# Patient Record
Sex: Female | Born: 1935 | Race: White | Hispanic: No | Smoking: Never smoker
Health system: Southern US, Community
[De-identification: ages and names within clinical notes are randomized; demographics above are authoritative.]

## PROBLEM LIST (undated history)

## (undated) DIAGNOSIS — K219 Gastro-esophageal reflux disease without esophagitis: Secondary | ICD-10-CM

## (undated) DIAGNOSIS — C801 Malignant (primary) neoplasm, unspecified: Secondary | ICD-10-CM

## (undated) DIAGNOSIS — M1990 Unspecified osteoarthritis, unspecified site: Secondary | ICD-10-CM

## (undated) DIAGNOSIS — I10 Essential (primary) hypertension: Secondary | ICD-10-CM

## (undated) DIAGNOSIS — IMO0001 Reserved for inherently not codable concepts without codable children: Secondary | ICD-10-CM

## (undated) DIAGNOSIS — H3530 Unspecified macular degeneration: Secondary | ICD-10-CM

## (undated) HISTORY — DX: Reserved for inherently not codable concepts without codable children: IMO0001

## (undated) HISTORY — DX: Malignant (primary) neoplasm, unspecified: C80.1

## (undated) HISTORY — DX: Unspecified macular degeneration: H35.30

## (undated) HISTORY — DX: Gastro-esophageal reflux disease without esophagitis: K21.9

## (undated) HISTORY — DX: Essential (primary) hypertension: I10

## (undated) HISTORY — DX: Unspecified osteoarthritis, unspecified site: M19.90

## (undated) HISTORY — PX: APPENDECTOMY: SHX54

## (undated) HISTORY — PX: TONSILLECTOMY: SHX5217

---

## 2000-04-24 ENCOUNTER — Encounter (INDEPENDENT_AMBULATORY_CARE_PROVIDER_SITE_OTHER): Payer: Self-pay

## 2000-04-24 ENCOUNTER — Ambulatory Visit (HOSPITAL_COMMUNITY): Admission: RE | Admit: 2000-04-24 | Discharge: 2000-04-24 | Payer: Self-pay | Admitting: Gastroenterology

## 2005-03-17 ENCOUNTER — Encounter: Admission: RE | Admit: 2005-03-17 | Discharge: 2005-03-17 | Payer: Self-pay | Admitting: Internal Medicine

## 2006-05-20 ENCOUNTER — Emergency Department (HOSPITAL_COMMUNITY): Admission: EM | Admit: 2006-05-20 | Discharge: 2006-05-20 | Payer: Self-pay | Admitting: Emergency Medicine

## 2007-01-28 ENCOUNTER — Emergency Department (HOSPITAL_COMMUNITY): Admission: EM | Admit: 2007-01-28 | Discharge: 2007-01-28 | Payer: Self-pay | Admitting: Emergency Medicine

## 2010-07-23 NOTE — Procedures (Signed)
[facility]  Patient:    Faith Bolton, Faith Bolton                      MRN: [id number] Proc. Date: 04/24/00 Adm. Date:  [id number] Attending:  Orland Mustard CC:         Laban Emperor. Cloward, M.D., [facility] [address]   Procedure Report  PROCEDURES PERFORMED:  Sigmoidoscopy and biopsy.  MEDICATIONS:  None.  SCOPE UTILIZED:  Olympus pediatric video colonoscope.  INDICATIONS:  Nice 77 year old woman who took Cleocin in December for an infected tooth and then developed diarrhea ever since then. She has been treating with Lomotil and lacto bacillus and had an elective appointment to see one of the physicians in our office at the end of March. Dr. Andi Devon called, she was still having diarrhea and felt that really should not wait that long. Given her clinical history of the use of Cleocin with the diarrhea starting soon after that, it was felt that we needed to go ahead with an unprepped sigmoid to rule out pseudomembranous colitis.  DESCRIPTION OF PROCEDURE:  The procedure had been explained to the patient and consent obtained. With the patient in the left lateral decubitus position, the Olympus pediatric video colonoscope was inserted. This was done in the unprepped state. The patient had some areas of solid stool; we passed this. There was moderate diverticulosis. We wiggled through all this around the stool. There were areas of inflammation and redness that were scattered with some mucous-type discharge, but no clear pseudomembranes. We went up to 50 cm and at this point it became uncomfortable to the patient and I elected to terminate the procedure. The scope was withdrawn. Several random biopsies were taken of the areas of apparent inflammation. The patient tolerated the procedure well and was maintained on low-flow oxygen and pulse oximetry throughout the procedure.  ASSESSMENT:  Resolving mild colitis, possibly pseudomembranous, but it  clearly seems to be improving.  PLAN:  We will go ahead and check the pathology report and see her back in the office on April 28, 2000 at 11:00.  We will keep her on the same medicines for now. DD:  04/24/00 TD:  04/24/00 Job: [id number]

## 2010-07-23 NOTE — Procedures (Signed)
[facility]  Patient:    Faith Bolton, Faith Bolton                      MRN: [id number] Proc. Date: 04/24/00 Adm. Date:  [id number] Attending:  Orland Mustard CC:         Gabriel Earing, M.D., [facility],  [address] [place], [place]   Procedure Report  PROCEDURE:  Sigmoidoscopy and biopsy.  MEDICATIONS:  None.  SCOPE:  Olympus pediatric colonoscope.  INDICATIONS FOR PROCEDURE:  A nice 77 year old woman who took Cleocin in December for an infection tooth and developed diarrhea every since then. She has been treated with Lomotil and Lactobacillus. She had an elective appointment to see one of the physicians in our office at the end of March. Dr. Andi Devon called, she was still having diarrhea and felt we really shouldnt wait that long. Given her clinical history, the use of Cleocin, diarrhea starting soon after that it was felt that we needed to go ahead with an unprepped sigmoid to rule out Pseudomonas colitis.  DESCRIPTION OF PROCEDURE:  The procedure had been explained to the patient and consent obtained. With the patient in the left lateral decubitus position, the Olympus pediatric video colonoscope was inserted. This was done in the unprepped state. The patient had some area of silent stool. We passed this. There was moderate diverticulosis. We wiggled through all the surrounding stool. There were areas of inflammation and redness that was scattered and some mucusy discharge but no clear pseudomembranes. We went up to 50 cm. At this point, it became uncomfortable to the patient and I elected to terminate the procedure. The scope was withdrawn. Several random biopsies were taken in the areas of apparent inflammation. The patient tolerated the procedure well and was maintained on low flow oxygen and pulse oximeter throughout the procedure.  ASSESSMENT:  Resolving mild colitis possibly pseudomembranous but it clearly seems to be improving.  PLAN:   Will go ahead and check report and see back in the office on February 22 at 11:00. Keep her on the same medicines for now. DD:  04/24/00 TD:  04/25/00 Job: [id number]

## 2010-12-14 LAB — POCT CARDIAC MARKERS
Myoglobin, poc: 137
Operator id: 272551
Operator id: 272551
Operator id: 272551
Troponin i, poc: <0.05
Troponin i, poc: <0.05

## 2010-12-14 LAB — URINE MICROSCOPIC-ADD ON

## 2010-12-14 LAB — BASIC METABOLIC PANEL
BUN: 9
CO2: 27
Calcium: 9.3
Glucose, Bld: 135 — ABNORMAL HIGH
Potassium: 4.4

## 2010-12-14 LAB — URINE CULTURE: Colony Count: 85000

## 2010-12-14 LAB — DIFFERENTIAL
Basophils Absolute: 0
Basophils Relative: 0
Eosinophils Absolute: 0.1 — ABNORMAL LOW
Eosinophils Relative: 1
Lymphs Abs: 1.7
Neutrophils Relative %: 65

## 2010-12-14 LAB — APTT: aPTT: 27

## 2010-12-14 LAB — URINALYSIS, ROUTINE W REFLEX MICROSCOPIC
Bilirubin Urine: NEGATIVE
Glucose, UA: NEGATIVE
Nitrite: NEGATIVE
Protein, ur: NEGATIVE
Urobilinogen, UA: 0.2

## 2010-12-14 LAB — CBC
Hemoglobin: 13.1
MCHC: 33.7
RDW: 13.3
WBC: 6.8

## 2010-12-14 LAB — D-DIMER, QUANTITATIVE: D-Dimer, Quant: 0.74 — ABNORMAL HIGH

## 2010-12-14 LAB — PROTIME-INR
INR: 0.9
Prothrombin Time: 12.4

## 2011-03-25 DIAGNOSIS — H353290 Exudative age-related macular degeneration, unspecified eye, stage unspecified: Secondary | ICD-10-CM | POA: Diagnosis not present

## 2011-03-25 DIAGNOSIS — H35059 Retinal neovascularization, unspecified, unspecified eye: Secondary | ICD-10-CM | POA: Diagnosis not present

## 2011-03-25 DIAGNOSIS — H43819 Vitreous degeneration, unspecified eye: Secondary | ICD-10-CM | POA: Diagnosis not present

## 2011-04-07 DIAGNOSIS — H3530 Unspecified macular degeneration: Secondary | ICD-10-CM | POA: Diagnosis not present

## 2011-04-07 DIAGNOSIS — H04129 Dry eye syndrome of unspecified lacrimal gland: Secondary | ICD-10-CM | POA: Diagnosis not present

## 2011-04-07 DIAGNOSIS — Z961 Presence of intraocular lens: Secondary | ICD-10-CM | POA: Diagnosis not present

## 2011-06-24 DIAGNOSIS — H43819 Vitreous degeneration, unspecified eye: Secondary | ICD-10-CM | POA: Diagnosis not present

## 2011-06-24 DIAGNOSIS — H34239 Retinal artery branch occlusion, unspecified eye: Secondary | ICD-10-CM | POA: Diagnosis not present

## 2011-06-24 DIAGNOSIS — H353290 Exudative age-related macular degeneration, unspecified eye, stage unspecified: Secondary | ICD-10-CM | POA: Diagnosis not present

## 2011-06-24 DIAGNOSIS — H35059 Retinal neovascularization, unspecified, unspecified eye: Secondary | ICD-10-CM | POA: Diagnosis not present

## 2011-07-07 DIAGNOSIS — H34239 Retinal artery branch occlusion, unspecified eye: Secondary | ICD-10-CM | POA: Diagnosis not present

## 2011-07-07 DIAGNOSIS — M1990 Unspecified osteoarthritis, unspecified site: Secondary | ICD-10-CM | POA: Diagnosis not present

## 2011-07-07 DIAGNOSIS — I10 Essential (primary) hypertension: Secondary | ICD-10-CM | POA: Diagnosis not present

## 2011-07-07 DIAGNOSIS — J301 Allergic rhinitis due to pollen: Secondary | ICD-10-CM | POA: Diagnosis not present

## 2011-07-08 DIAGNOSIS — H349 Unspecified retinal vascular occlusion: Secondary | ICD-10-CM | POA: Diagnosis not present

## 2011-10-28 DIAGNOSIS — H43819 Vitreous degeneration, unspecified eye: Secondary | ICD-10-CM | POA: Diagnosis not present

## 2011-10-28 DIAGNOSIS — H353290 Exudative age-related macular degeneration, unspecified eye, stage unspecified: Secondary | ICD-10-CM | POA: Diagnosis not present

## 2011-10-28 DIAGNOSIS — H34239 Retinal artery branch occlusion, unspecified eye: Secondary | ICD-10-CM | POA: Diagnosis not present

## 2011-10-28 DIAGNOSIS — H35059 Retinal neovascularization, unspecified, unspecified eye: Secondary | ICD-10-CM | POA: Diagnosis not present

## 2011-12-30 DIAGNOSIS — H353290 Exudative age-related macular degeneration, unspecified eye, stage unspecified: Secondary | ICD-10-CM | POA: Diagnosis not present

## 2011-12-30 DIAGNOSIS — H43819 Vitreous degeneration, unspecified eye: Secondary | ICD-10-CM | POA: Diagnosis not present

## 2011-12-30 DIAGNOSIS — H35059 Retinal neovascularization, unspecified, unspecified eye: Secondary | ICD-10-CM | POA: Diagnosis not present

## 2011-12-30 DIAGNOSIS — H34239 Retinal artery branch occlusion, unspecified eye: Secondary | ICD-10-CM | POA: Diagnosis not present

## 2012-01-19 DIAGNOSIS — Z23 Encounter for immunization: Secondary | ICD-10-CM | POA: Diagnosis not present

## 2012-01-19 DIAGNOSIS — R82998 Other abnormal findings in urine: Secondary | ICD-10-CM | POA: Diagnosis not present

## 2012-01-19 DIAGNOSIS — I10 Essential (primary) hypertension: Secondary | ICD-10-CM | POA: Diagnosis not present

## 2012-01-19 DIAGNOSIS — M1990 Unspecified osteoarthritis, unspecified site: Secondary | ICD-10-CM | POA: Diagnosis not present

## 2012-01-19 DIAGNOSIS — J301 Allergic rhinitis due to pollen: Secondary | ICD-10-CM | POA: Diagnosis not present

## 2012-01-19 DIAGNOSIS — H3530 Unspecified macular degeneration: Secondary | ICD-10-CM | POA: Diagnosis not present

## 2012-04-12 DIAGNOSIS — Z961 Presence of intraocular lens: Secondary | ICD-10-CM | POA: Diagnosis not present

## 2012-04-12 DIAGNOSIS — H04129 Dry eye syndrome of unspecified lacrimal gland: Secondary | ICD-10-CM | POA: Diagnosis not present

## 2012-04-12 DIAGNOSIS — H3530 Unspecified macular degeneration: Secondary | ICD-10-CM | POA: Diagnosis not present

## 2012-04-13 DIAGNOSIS — M2010 Hallux valgus (acquired), unspecified foot: Secondary | ICD-10-CM | POA: Diagnosis not present

## 2012-04-13 DIAGNOSIS — L608 Other nail disorders: Secondary | ICD-10-CM | POA: Diagnosis not present

## 2012-05-04 DIAGNOSIS — H35379 Puckering of macula, unspecified eye: Secondary | ICD-10-CM | POA: Diagnosis not present

## 2012-05-04 DIAGNOSIS — H353290 Exudative age-related macular degeneration, unspecified eye, stage unspecified: Secondary | ICD-10-CM | POA: Diagnosis not present

## 2012-05-04 DIAGNOSIS — H34239 Retinal artery branch occlusion, unspecified eye: Secondary | ICD-10-CM | POA: Diagnosis not present

## 2012-05-04 DIAGNOSIS — H43819 Vitreous degeneration, unspecified eye: Secondary | ICD-10-CM | POA: Diagnosis not present

## 2012-07-19 DIAGNOSIS — Z6828 Body mass index (BMI) 28.0-28.9, adult: Secondary | ICD-10-CM | POA: Diagnosis not present

## 2012-07-19 DIAGNOSIS — J301 Allergic rhinitis due to pollen: Secondary | ICD-10-CM | POA: Diagnosis not present

## 2012-07-19 DIAGNOSIS — H3530 Unspecified macular degeneration: Secondary | ICD-10-CM | POA: Diagnosis not present

## 2012-07-19 DIAGNOSIS — R269 Unspecified abnormalities of gait and mobility: Secondary | ICD-10-CM | POA: Diagnosis not present

## 2012-07-19 DIAGNOSIS — M1990 Unspecified osteoarthritis, unspecified site: Secondary | ICD-10-CM | POA: Diagnosis not present

## 2012-07-19 DIAGNOSIS — I10 Essential (primary) hypertension: Secondary | ICD-10-CM | POA: Diagnosis not present

## 2012-07-19 DIAGNOSIS — Z1331 Encounter for screening for depression: Secondary | ICD-10-CM | POA: Diagnosis not present

## 2012-11-08 DIAGNOSIS — R05 Cough: Secondary | ICD-10-CM | POA: Diagnosis not present

## 2012-11-08 DIAGNOSIS — R509 Fever, unspecified: Secondary | ICD-10-CM | POA: Diagnosis not present

## 2012-11-08 DIAGNOSIS — R82998 Other abnormal findings in urine: Secondary | ICD-10-CM | POA: Diagnosis not present

## 2012-11-08 DIAGNOSIS — N390 Urinary tract infection, site not specified: Secondary | ICD-10-CM | POA: Diagnosis not present

## 2012-11-08 DIAGNOSIS — Z6828 Body mass index (BMI) 28.0-28.9, adult: Secondary | ICD-10-CM | POA: Diagnosis not present

## 2012-11-08 DIAGNOSIS — M545 Low back pain: Secondary | ICD-10-CM | POA: Diagnosis not present

## 2012-11-08 DIAGNOSIS — R944 Abnormal results of kidney function studies: Secondary | ICD-10-CM | POA: Diagnosis not present

## 2012-11-16 DIAGNOSIS — R509 Fever, unspecified: Secondary | ICD-10-CM | POA: Diagnosis not present

## 2012-11-16 DIAGNOSIS — R05 Cough: Secondary | ICD-10-CM | POA: Diagnosis not present

## 2012-11-16 DIAGNOSIS — N390 Urinary tract infection, site not specified: Secondary | ICD-10-CM | POA: Diagnosis not present

## 2012-11-16 DIAGNOSIS — Z6828 Body mass index (BMI) 28.0-28.9, adult: Secondary | ICD-10-CM | POA: Diagnosis not present

## 2012-11-20 DIAGNOSIS — H35059 Retinal neovascularization, unspecified, unspecified eye: Secondary | ICD-10-CM | POA: Diagnosis not present

## 2012-11-20 DIAGNOSIS — H353290 Exudative age-related macular degeneration, unspecified eye, stage unspecified: Secondary | ICD-10-CM | POA: Diagnosis not present

## 2012-12-26 DIAGNOSIS — R05 Cough: Secondary | ICD-10-CM | POA: Diagnosis not present

## 2012-12-26 DIAGNOSIS — R110 Nausea: Secondary | ICD-10-CM | POA: Diagnosis not present

## 2012-12-26 DIAGNOSIS — J301 Allergic rhinitis due to pollen: Secondary | ICD-10-CM | POA: Diagnosis not present

## 2012-12-26 DIAGNOSIS — Z6828 Body mass index (BMI) 28.0-28.9, adult: Secondary | ICD-10-CM | POA: Diagnosis not present

## 2012-12-26 DIAGNOSIS — Z23 Encounter for immunization: Secondary | ICD-10-CM | POA: Diagnosis not present

## 2013-01-25 ENCOUNTER — Other Ambulatory Visit: Payer: Self-pay | Admitting: Internal Medicine

## 2013-01-25 DIAGNOSIS — R110 Nausea: Secondary | ICD-10-CM

## 2013-01-25 DIAGNOSIS — I10 Essential (primary) hypertension: Secondary | ICD-10-CM | POA: Diagnosis not present

## 2013-01-25 DIAGNOSIS — R002 Palpitations: Secondary | ICD-10-CM | POA: Diagnosis not present

## 2013-01-25 DIAGNOSIS — Z6827 Body mass index (BMI) 27.0-27.9, adult: Secondary | ICD-10-CM | POA: Diagnosis not present

## 2013-01-25 DIAGNOSIS — R05 Cough: Secondary | ICD-10-CM | POA: Diagnosis not present

## 2013-02-04 ENCOUNTER — Ambulatory Visit
Admission: RE | Admit: 2013-02-04 | Discharge: 2013-02-04 | Disposition: A | Discharge status: Home / Self Care | Payer: Medicare Other | Source: Ambulatory Visit | Attending: Internal Medicine | Admitting: Internal Medicine

## 2013-02-04 ENCOUNTER — Other Ambulatory Visit: Payer: Self-pay | Admitting: Internal Medicine

## 2013-02-04 DIAGNOSIS — K228 Other specified diseases of esophagus: Secondary | ICD-10-CM | POA: Diagnosis not present

## 2013-02-04 DIAGNOSIS — R110 Nausea: Secondary | ICD-10-CM

## 2013-05-08 DIAGNOSIS — M79609 Pain in unspecified limb: Secondary | ICD-10-CM | POA: Diagnosis not present

## 2013-05-08 DIAGNOSIS — N390 Urinary tract infection, site not specified: Secondary | ICD-10-CM | POA: Diagnosis not present

## 2013-05-08 DIAGNOSIS — H3530 Unspecified macular degeneration: Secondary | ICD-10-CM | POA: Diagnosis not present

## 2013-05-08 DIAGNOSIS — Z6828 Body mass index (BMI) 28.0-28.9, adult: Secondary | ICD-10-CM | POA: Diagnosis not present

## 2013-05-08 DIAGNOSIS — I10 Essential (primary) hypertension: Secondary | ICD-10-CM | POA: Diagnosis not present

## 2013-05-08 DIAGNOSIS — R110 Nausea: Secondary | ICD-10-CM | POA: Diagnosis not present

## 2013-05-08 DIAGNOSIS — M1990 Unspecified osteoarthritis, unspecified site: Secondary | ICD-10-CM | POA: Diagnosis not present

## 2013-05-08 DIAGNOSIS — R809 Proteinuria, unspecified: Secondary | ICD-10-CM | POA: Diagnosis not present

## 2013-05-24 ENCOUNTER — Ambulatory Visit: Payer: Medicare Other | Admitting: Podiatrist

## 2013-05-29 DIAGNOSIS — H34239 Retinal artery branch occlusion, unspecified eye: Secondary | ICD-10-CM | POA: Diagnosis not present

## 2013-05-29 DIAGNOSIS — H35059 Retinal neovascularization, unspecified, unspecified eye: Secondary | ICD-10-CM | POA: Diagnosis not present

## 2013-05-29 DIAGNOSIS — H35379 Puckering of macula, unspecified eye: Secondary | ICD-10-CM | POA: Diagnosis not present

## 2013-05-29 DIAGNOSIS — H353290 Exudative age-related macular degeneration, unspecified eye, stage unspecified: Secondary | ICD-10-CM | POA: Diagnosis not present

## 2013-06-18 ENCOUNTER — Encounter: Payer: Self-pay | Admitting: Podiatry

## 2013-06-18 ENCOUNTER — Ambulatory Visit (INDEPENDENT_AMBULATORY_CARE_PROVIDER_SITE_OTHER): Payer: Medicare Other | Admitting: Podiatry

## 2013-06-18 VITALS — BP 124/76 | HR 73 | Resp 16 | Ht 64.0 in | Wt 167.0 lb

## 2013-06-18 DIAGNOSIS — B351 Tinea unguium: Secondary | ICD-10-CM | POA: Diagnosis not present

## 2013-06-18 DIAGNOSIS — M79609 Pain in unspecified limb: Secondary | ICD-10-CM | POA: Diagnosis not present

## 2013-06-18 DIAGNOSIS — L600 Ingrowing nail: Secondary | ICD-10-CM | POA: Diagnosis not present

## 2013-06-18 MED ORDER — NEOMYCIN-POLYMYXIN-HC 3.5-10000-1 OT SOLN: OTIC | Status: DC

## 2013-06-18 NOTE — Progress Notes (Signed)
   Subjective:    Patient ID: Faith Bolton, female    DOB: [birth year], 80 y.o.   MRN: [id number]  HPI Comments: Left great toe is red and swollen and painful. Thick toenail, it was cut on before, its been about 1 year with it   Toe Pain       Review of Systems  HENT:       Sinus problems   Eyes: Positive for visual disturbance.  Cardiovascular: Positive for leg swelling.  Genitourinary: Positive for frequency.  Musculoskeletal:       Back pain  Difficulty walking   All other systems reviewed and are negative.      Objective:   Physical Exam: I have reviewed her past medical history medications allergies surgeries social history. Pulses are palpable bilateral. Capillary fill time to digits one through 5 is immediate in the feet are warm to the touch. Neurologic sensorium is intact. Deep tendon reflexes are not elicitable today muscle strength is normal bilateral. Orthopedic evaluation demonstrates hammertoe deformities and hallux abductovalgus deformities bilateral. Left greater than right. Cutaneous evaluation demonstrates thick yellow dystrophic onychomycotic nails bilateral. Painful ingrown toenail with nail dystrophy of the hallux left mild erythema mild paronychia also noted hallux left.        Assessment & Plan:  Assessment: Ingrown nail paronychia abscess hallux left. Pain in limb secondary to onychomycosis 1 through 5 bilateral.  Plan: At this point a chemical matrixectomy was performed to the toenail plate of the hallux left. She tolerated procedure well with local anesthetic. I also debrided her nails 1 through 5 bilateral. I will followup with her in one week.

## 2013-06-18 NOTE — Patient Instructions (Signed)

## 2013-06-25 ENCOUNTER — Ambulatory Visit (INDEPENDENT_AMBULATORY_CARE_PROVIDER_SITE_OTHER): Payer: Medicare Other | Admitting: Podiatry

## 2013-06-25 ENCOUNTER — Encounter: Payer: Self-pay | Admitting: Podiatry

## 2013-06-25 VITALS — BP 122/76 | HR 72 | Resp 18

## 2013-06-25 DIAGNOSIS — L600 Ingrowing nail: Secondary | ICD-10-CM

## 2013-06-25 NOTE — Progress Notes (Signed)
She presents today for followup of total nail avulsion hallux left. She continues to soak in Betadine and water has been doing well with this. She is very little symptoms and continues to dress the toe daily.  Objective: Vital signs are stable she is alert and oriented x3 she is nice granulation tissue with some islands of epithelialization occurring. Hallux left appears to be healing quite nicely.  Assessment: Well-healing matrixectomy nail avulsion hallux left.  Plan: Discontinue Betadine start with Epsom salts warm water soaks covered in the day and leave open at night. Continue to soak for the next couple of weeks and then followup with me to assure that we had no infection. She will slight for signs and symptoms of infection to notify me if there are any.

## 2013-07-09 ENCOUNTER — Ambulatory Visit (INDEPENDENT_AMBULATORY_CARE_PROVIDER_SITE_OTHER): Payer: Medicare Other | Admitting: Podiatry

## 2013-07-09 ENCOUNTER — Encounter: Payer: Self-pay | Admitting: Podiatry

## 2013-07-09 VITALS — BP 122/76 | HR 72 | Resp 16

## 2013-07-09 DIAGNOSIS — L600 Ingrowing nail: Secondary | ICD-10-CM

## 2013-07-09 NOTE — Progress Notes (Signed)
She presents today 3 weeks status post total nail avulsion hallux left. She denies fever chills nausea vomiting muscle aches and pains. Continues to soak daily in Epsom salts and water. She continues to use of the Cortisporin Otic.  Objective: Vital signs are stable she is alert and oriented x3 dry sterile dressing on toe hallux left was removed demonstrates mild granulation tissue with epithelialization to the hallux nail bed left. I see no signs of infection.  Assessment: Nail avulsion healing well left.  Plan: Continue to soak in Epsom salts and water apply Cortisporin otic daily cover and I will followup with her in 2 weeks.

## 2013-07-23 ENCOUNTER — Ambulatory Visit (INDEPENDENT_AMBULATORY_CARE_PROVIDER_SITE_OTHER): Payer: Medicare Other | Admitting: Podiatry

## 2013-07-23 ENCOUNTER — Encounter: Payer: Self-pay | Admitting: Podiatry

## 2013-07-23 VITALS — BP 114/76 | HR 68 | Resp 16

## 2013-07-23 DIAGNOSIS — L600 Ingrowing nail: Secondary | ICD-10-CM | POA: Diagnosis not present

## 2013-07-23 NOTE — Progress Notes (Signed)
She presents today for followup nail avulsion hallux left. She states she start soaking it twice daily. It feels like is doing a lot better.  Objective: Vital signs are stable she is alert and oriented x3. Pulses are palpable left. Hallux nail bed appears to be granulating in very nicely epithelialization is appear to be developing and the skin I see no signs of infection.  Assessment: Well-healing surgical foot hallux left.  Plan: Start soaking once daily. Followup with me on an as needed basis. Continue to soak and to completely well.

## 2013-09-27 DIAGNOSIS — H353290 Exudative age-related macular degeneration, unspecified eye, stage unspecified: Secondary | ICD-10-CM | POA: Diagnosis not present

## 2013-09-27 DIAGNOSIS — H43819 Vitreous degeneration, unspecified eye: Secondary | ICD-10-CM | POA: Diagnosis not present

## 2013-09-27 DIAGNOSIS — H34239 Retinal artery branch occlusion, unspecified eye: Secondary | ICD-10-CM | POA: Diagnosis not present

## 2013-09-27 DIAGNOSIS — H35379 Puckering of macula, unspecified eye: Secondary | ICD-10-CM | POA: Diagnosis not present

## 2013-11-12 DIAGNOSIS — H3530 Unspecified macular degeneration: Secondary | ICD-10-CM | POA: Diagnosis not present

## 2013-11-12 DIAGNOSIS — Z1331 Encounter for screening for depression: Secondary | ICD-10-CM | POA: Diagnosis not present

## 2013-11-12 DIAGNOSIS — J301 Allergic rhinitis due to pollen: Secondary | ICD-10-CM | POA: Diagnosis not present

## 2013-11-12 DIAGNOSIS — R110 Nausea: Secondary | ICD-10-CM | POA: Diagnosis not present

## 2013-11-12 DIAGNOSIS — R269 Unspecified abnormalities of gait and mobility: Secondary | ICD-10-CM | POA: Diagnosis not present

## 2013-11-12 DIAGNOSIS — I10 Essential (primary) hypertension: Secondary | ICD-10-CM | POA: Diagnosis not present

## 2014-01-08 DIAGNOSIS — Z6828 Body mass index (BMI) 28.0-28.9, adult: Secondary | ICD-10-CM | POA: Diagnosis not present

## 2014-01-08 DIAGNOSIS — H04329 Acute dacryocystitis of unspecified lacrimal passage: Secondary | ICD-10-CM | POA: Diagnosis not present

## 2014-01-08 DIAGNOSIS — Z23 Encounter for immunization: Secondary | ICD-10-CM | POA: Diagnosis not present

## 2014-04-15 DIAGNOSIS — Z961 Presence of intraocular lens: Secondary | ICD-10-CM | POA: Diagnosis not present

## 2014-04-15 DIAGNOSIS — H02831 Dermatochalasis of right upper eyelid: Secondary | ICD-10-CM | POA: Diagnosis not present

## 2014-04-15 DIAGNOSIS — H02834 Dermatochalasis of left upper eyelid: Secondary | ICD-10-CM | POA: Diagnosis not present

## 2014-04-15 DIAGNOSIS — H1851 Endothelial corneal dystrophy: Secondary | ICD-10-CM | POA: Diagnosis not present

## 2014-04-15 DIAGNOSIS — H3531 Nonexudative age-related macular degeneration: Secondary | ICD-10-CM | POA: Diagnosis not present

## 2014-04-25 DIAGNOSIS — H3532 Exudative age-related macular degeneration: Secondary | ICD-10-CM | POA: Diagnosis not present

## 2014-05-13 DIAGNOSIS — M1990 Unspecified osteoarthritis, unspecified site: Secondary | ICD-10-CM | POA: Diagnosis not present

## 2014-05-13 DIAGNOSIS — N390 Urinary tract infection, site not specified: Secondary | ICD-10-CM | POA: Diagnosis not present

## 2014-05-13 DIAGNOSIS — J302 Other seasonal allergic rhinitis: Secondary | ICD-10-CM | POA: Diagnosis not present

## 2014-05-13 DIAGNOSIS — H3530 Unspecified macular degeneration: Secondary | ICD-10-CM | POA: Diagnosis not present

## 2014-05-13 DIAGNOSIS — Z6828 Body mass index (BMI) 28.0-28.9, adult: Secondary | ICD-10-CM | POA: Diagnosis not present

## 2014-05-13 DIAGNOSIS — Z1389 Encounter for screening for other disorder: Secondary | ICD-10-CM | POA: Diagnosis not present

## 2014-05-13 DIAGNOSIS — R002 Palpitations: Secondary | ICD-10-CM | POA: Diagnosis not present

## 2014-05-13 DIAGNOSIS — R2689 Other abnormalities of gait and mobility: Secondary | ICD-10-CM | POA: Diagnosis not present

## 2014-05-13 DIAGNOSIS — I10 Essential (primary) hypertension: Secondary | ICD-10-CM | POA: Diagnosis not present

## 2014-05-13 DIAGNOSIS — R8299 Other abnormal findings in urine: Secondary | ICD-10-CM | POA: Diagnosis not present

## 2014-08-14 DIAGNOSIS — M5431 Sciatica, right side: Secondary | ICD-10-CM | POA: Diagnosis not present

## 2014-11-11 DIAGNOSIS — I10 Essential (primary) hypertension: Secondary | ICD-10-CM | POA: Diagnosis not present

## 2014-11-11 DIAGNOSIS — M419 Scoliosis, unspecified: Secondary | ICD-10-CM | POA: Diagnosis not present

## 2014-11-11 DIAGNOSIS — H3530 Unspecified macular degeneration: Secondary | ICD-10-CM | POA: Diagnosis not present

## 2014-11-11 DIAGNOSIS — M519 Unspecified thoracic, thoracolumbar and lumbosacral intervertebral disc disorder: Secondary | ICD-10-CM | POA: Diagnosis not present

## 2014-11-11 DIAGNOSIS — Z6827 Body mass index (BMI) 27.0-27.9, adult: Secondary | ICD-10-CM | POA: Diagnosis not present

## 2014-11-11 DIAGNOSIS — M1990 Unspecified osteoarthritis, unspecified site: Secondary | ICD-10-CM | POA: Diagnosis not present

## 2014-11-11 DIAGNOSIS — M47819 Spondylosis without myelopathy or radiculopathy, site unspecified: Secondary | ICD-10-CM | POA: Diagnosis not present

## 2014-12-05 DIAGNOSIS — H3532 Exudative age-related macular degeneration: Secondary | ICD-10-CM | POA: Diagnosis not present

## 2014-12-05 DIAGNOSIS — H43813 Vitreous degeneration, bilateral: Secondary | ICD-10-CM | POA: Diagnosis not present

## 2014-12-05 DIAGNOSIS — H34231 Retinal artery branch occlusion, right eye: Secondary | ICD-10-CM | POA: Diagnosis not present

## 2014-12-16 DIAGNOSIS — M4806 Spinal stenosis, lumbar region: Secondary | ICD-10-CM | POA: Diagnosis not present

## 2015-01-12 DIAGNOSIS — Z23 Encounter for immunization: Secondary | ICD-10-CM | POA: Diagnosis not present

## 2015-01-27 DIAGNOSIS — M4806 Spinal stenosis, lumbar region: Secondary | ICD-10-CM | POA: Diagnosis not present

## 2015-02-13 DIAGNOSIS — M4806 Spinal stenosis, lumbar region: Secondary | ICD-10-CM | POA: Diagnosis not present

## 2015-04-10 DIAGNOSIS — Z1389 Encounter for screening for other disorder: Secondary | ICD-10-CM | POA: Diagnosis not present

## 2015-04-10 DIAGNOSIS — I10 Essential (primary) hypertension: Secondary | ICD-10-CM | POA: Diagnosis not present

## 2015-04-10 DIAGNOSIS — Z6827 Body mass index (BMI) 27.0-27.9, adult: Secondary | ICD-10-CM | POA: Diagnosis not present

## 2015-04-10 DIAGNOSIS — R040 Epistaxis: Secondary | ICD-10-CM | POA: Diagnosis not present

## 2015-04-10 DIAGNOSIS — M4806 Spinal stenosis, lumbar region: Secondary | ICD-10-CM | POA: Diagnosis not present

## 2015-04-10 DIAGNOSIS — H3530 Unspecified macular degeneration: Secondary | ICD-10-CM | POA: Diagnosis not present

## 2015-06-05 DIAGNOSIS — H353233 Exudative age-related macular degeneration, bilateral, with inactive scar: Secondary | ICD-10-CM | POA: Diagnosis not present

## 2015-06-05 DIAGNOSIS — H43813 Vitreous degeneration, bilateral: Secondary | ICD-10-CM | POA: Diagnosis not present

## 2015-06-05 DIAGNOSIS — H34231 Retinal artery branch occlusion, right eye: Secondary | ICD-10-CM | POA: Diagnosis not present

## 2015-09-11 DIAGNOSIS — H353233 Exudative age-related macular degeneration, bilateral, with inactive scar: Secondary | ICD-10-CM | POA: Diagnosis not present

## 2015-09-11 DIAGNOSIS — H43813 Vitreous degeneration, bilateral: Secondary | ICD-10-CM | POA: Diagnosis not present

## 2015-09-11 DIAGNOSIS — H35423 Microcystoid degeneration of retina, bilateral: Secondary | ICD-10-CM | POA: Diagnosis not present

## 2015-10-08 DIAGNOSIS — M1990 Unspecified osteoarthritis, unspecified site: Secondary | ICD-10-CM | POA: Diagnosis not present

## 2015-10-08 DIAGNOSIS — J302 Other seasonal allergic rhinitis: Secondary | ICD-10-CM | POA: Diagnosis not present

## 2015-10-08 DIAGNOSIS — I10 Essential (primary) hypertension: Secondary | ICD-10-CM | POA: Diagnosis not present

## 2015-10-08 DIAGNOSIS — Z6828 Body mass index (BMI) 28.0-28.9, adult: Secondary | ICD-10-CM | POA: Diagnosis not present

## 2015-10-08 DIAGNOSIS — M4806 Spinal stenosis, lumbar region: Secondary | ICD-10-CM | POA: Diagnosis not present

## 2015-10-08 DIAGNOSIS — H3530 Unspecified macular degeneration: Secondary | ICD-10-CM | POA: Diagnosis not present

## 2015-10-08 DIAGNOSIS — R2689 Other abnormalities of gait and mobility: Secondary | ICD-10-CM | POA: Diagnosis not present

## 2015-12-18 DIAGNOSIS — H353233 Exudative age-related macular degeneration, bilateral, with inactive scar: Secondary | ICD-10-CM | POA: Diagnosis not present

## 2015-12-18 DIAGNOSIS — H43813 Vitreous degeneration, bilateral: Secondary | ICD-10-CM | POA: Diagnosis not present

## 2015-12-18 DIAGNOSIS — H34231 Retinal artery branch occlusion, right eye: Secondary | ICD-10-CM | POA: Diagnosis not present

## 2015-12-18 DIAGNOSIS — H35423 Microcystoid degeneration of retina, bilateral: Secondary | ICD-10-CM | POA: Diagnosis not present

## 2015-12-28 DIAGNOSIS — Z23 Encounter for immunization: Secondary | ICD-10-CM | POA: Diagnosis not present

## 2016-03-18 DIAGNOSIS — H353233 Exudative age-related macular degeneration, bilateral, with inactive scar: Secondary | ICD-10-CM | POA: Diagnosis not present

## 2016-04-05 DIAGNOSIS — I10 Essential (primary) hypertension: Secondary | ICD-10-CM | POA: Diagnosis not present

## 2016-04-05 DIAGNOSIS — Z6828 Body mass index (BMI) 28.0-28.9, adult: Secondary | ICD-10-CM | POA: Diagnosis not present

## 2016-04-05 DIAGNOSIS — M48061 Spinal stenosis, lumbar region without neurogenic claudication: Secondary | ICD-10-CM | POA: Diagnosis not present

## 2016-04-05 DIAGNOSIS — J302 Other seasonal allergic rhinitis: Secondary | ICD-10-CM | POA: Diagnosis not present

## 2016-04-05 DIAGNOSIS — R2689 Other abnormalities of gait and mobility: Secondary | ICD-10-CM | POA: Diagnosis not present

## 2016-04-05 DIAGNOSIS — M1990 Unspecified osteoarthritis, unspecified site: Secondary | ICD-10-CM | POA: Diagnosis not present

## 2016-04-05 DIAGNOSIS — Z1389 Encounter for screening for other disorder: Secondary | ICD-10-CM | POA: Diagnosis not present

## 2016-06-24 DIAGNOSIS — H353232 Exudative age-related macular degeneration, bilateral, with inactive choroidal neovascularization: Secondary | ICD-10-CM | POA: Diagnosis not present

## 2016-06-24 DIAGNOSIS — H43813 Vitreous degeneration, bilateral: Secondary | ICD-10-CM | POA: Diagnosis not present

## 2016-06-24 DIAGNOSIS — H34211 Partial retinal artery occlusion, right eye: Secondary | ICD-10-CM | POA: Diagnosis not present

## 2016-06-24 DIAGNOSIS — H35423 Microcystoid degeneration of retina, bilateral: Secondary | ICD-10-CM | POA: Diagnosis not present

## 2016-10-07 DIAGNOSIS — I10 Essential (primary) hypertension: Secondary | ICD-10-CM | POA: Diagnosis not present

## 2016-10-07 DIAGNOSIS — M48061 Spinal stenosis, lumbar region without neurogenic claudication: Secondary | ICD-10-CM | POA: Diagnosis not present

## 2016-10-07 DIAGNOSIS — M1990 Unspecified osteoarthritis, unspecified site: Secondary | ICD-10-CM | POA: Diagnosis not present

## 2016-10-07 DIAGNOSIS — Z6828 Body mass index (BMI) 28.0-28.9, adult: Secondary | ICD-10-CM | POA: Diagnosis not present

## 2016-10-07 DIAGNOSIS — R110 Nausea: Secondary | ICD-10-CM | POA: Diagnosis not present

## 2016-10-07 DIAGNOSIS — R2689 Other abnormalities of gait and mobility: Secondary | ICD-10-CM | POA: Diagnosis not present

## 2016-10-07 DIAGNOSIS — J302 Other seasonal allergic rhinitis: Secondary | ICD-10-CM | POA: Diagnosis not present

## 2016-12-23 DIAGNOSIS — H34211 Partial retinal artery occlusion, right eye: Secondary | ICD-10-CM | POA: Diagnosis not present

## 2016-12-23 DIAGNOSIS — H35423 Microcystoid degeneration of retina, bilateral: Secondary | ICD-10-CM | POA: Diagnosis not present

## 2016-12-23 DIAGNOSIS — H43813 Vitreous degeneration, bilateral: Secondary | ICD-10-CM | POA: Diagnosis not present

## 2016-12-23 DIAGNOSIS — H353233 Exudative age-related macular degeneration, bilateral, with inactive scar: Secondary | ICD-10-CM | POA: Diagnosis not present

## 2017-01-07 DIAGNOSIS — Z23 Encounter for immunization: Secondary | ICD-10-CM | POA: Diagnosis not present

## 2017-05-26 DIAGNOSIS — Z6828 Body mass index (BMI) 28.0-28.9, adult: Secondary | ICD-10-CM | POA: Diagnosis not present

## 2017-05-26 DIAGNOSIS — M25551 Pain in right hip: Secondary | ICD-10-CM | POA: Diagnosis not present

## 2017-05-26 DIAGNOSIS — M1990 Unspecified osteoarthritis, unspecified site: Secondary | ICD-10-CM | POA: Diagnosis not present

## 2017-05-26 DIAGNOSIS — I10 Essential (primary) hypertension: Secondary | ICD-10-CM | POA: Diagnosis not present

## 2017-05-26 DIAGNOSIS — Z1389 Encounter for screening for other disorder: Secondary | ICD-10-CM | POA: Diagnosis not present

## 2017-05-26 DIAGNOSIS — M48061 Spinal stenosis, lumbar region without neurogenic claudication: Secondary | ICD-10-CM | POA: Diagnosis not present

## 2017-05-26 DIAGNOSIS — R110 Nausea: Secondary | ICD-10-CM | POA: Diagnosis not present

## 2017-05-26 DIAGNOSIS — H3530 Unspecified macular degeneration: Secondary | ICD-10-CM | POA: Diagnosis not present

## 2017-05-26 DIAGNOSIS — R2689 Other abnormalities of gait and mobility: Secondary | ICD-10-CM | POA: Diagnosis not present

## 2017-05-26 DIAGNOSIS — J302 Other seasonal allergic rhinitis: Secondary | ICD-10-CM | POA: Diagnosis not present

## 2017-06-30 DIAGNOSIS — H43813 Vitreous degeneration, bilateral: Secondary | ICD-10-CM | POA: Diagnosis not present

## 2017-06-30 DIAGNOSIS — H34231 Retinal artery branch occlusion, right eye: Secondary | ICD-10-CM | POA: Diagnosis not present

## 2017-06-30 DIAGNOSIS — H35423 Microcystoid degeneration of retina, bilateral: Secondary | ICD-10-CM | POA: Diagnosis not present

## 2017-06-30 DIAGNOSIS — H353233 Exudative age-related macular degeneration, bilateral, with inactive scar: Secondary | ICD-10-CM | POA: Diagnosis not present

## 2017-11-14 ENCOUNTER — Encounter: Payer: Self-pay | Admitting: Podiatry

## 2017-11-14 ENCOUNTER — Ambulatory Visit (INDEPENDENT_AMBULATORY_CARE_PROVIDER_SITE_OTHER): Payer: Medicare Other | Admitting: Podiatry

## 2017-11-14 VITALS — BP 115/55 | HR 74 | Resp 16

## 2017-11-14 DIAGNOSIS — B351 Tinea unguium: Secondary | ICD-10-CM

## 2017-11-14 DIAGNOSIS — M79676 Pain in unspecified toe(s): Secondary | ICD-10-CM | POA: Diagnosis not present

## 2017-11-15 NOTE — Progress Notes (Signed)
  Subjective:  Patient ID: Faith Bolton, female    DOB: 1920-01-30,  MRN: [id number] HPI Chief Complaint  Patient presents with  . Debridement    Requesting nail care - toenails long and thick  . New Patient (Initial Visit)    Est pt 2015    84 y.o. female presents with the above complaint.   ROS: Denies fever chills nausea vomiting muscle aches pains calf pain back pain chest pain shortness of breath.  Past Medical History:  Diagnosis Date  . Cancer ([place])   . Hypertension   . Macular degeneration (senile) of retina, unspecified   . Osteoarthrosis, unspecified whether generalized or localized, unspecified site   . Reflux      Current Outpatient Medications:  .  loratadine (CLARITIN) 10 MG tablet, Take 10 mg by mouth daily., Disp: , Rfl:  .  acetaminophen (TYLENOL) 325 MG tablet, Take 650 mg by mouth every 6 (six) hours as needed., Disp: , Rfl:  .  aspirin 81 MG tablet, Take 81 mg by mouth daily., Disp: , Rfl:  .  BENICAR 20 MG tablet, , Disp: , Rfl:  .  Calcium-Vitamin D-Vitamin K (CALCIUM + D + K PO), Take by mouth., Disp: , Rfl:  .  glucosamine-chondroitin 500-400 MG tablet, Take 1 tablet by mouth 3 (three) times daily., Disp: , Rfl:  .  Multiple Vitamins-Minerals (CENTRUM SILVER ADULT 50+ PO), Take by mouth., Disp: , Rfl:  .  omeprazole (PRILOSEC) 20 MG capsule, , Disp: , Rfl:  .  propranolol (INDERAL) 10 MG tablet, , Disp: , Rfl:   Allergies  Allergen Reactions  . Ciprofloxacin Other (See Comments)    Pt does not remember   . Clindamycin/Lincomycin Diarrhea  . Diphenoxylate-Atropine   . Metronidazole    Review of Systems Objective:   Vitals:   11/14/17 1608  BP: (!) 115/55  Pulse: 74  Resp: 16    General: Well developed, nourished, in no acute distress, alert and oriented x3   Dermatological: Skin is warm, dry and supple bilateral. Nails x 10 are well maintained; remaining integument appears unremarkable at this time. There are no open sores, no  preulcerative lesions, no rash or signs of infection present.  Toenails are thick yellow dystrophic onychomycotic severely incurvated and painful 1 through 5 right 2 through 5 left.  Vascular: Dorsalis Pedis artery and Posterior Tibial artery pedal pulses are 2/4 bilateral with immedate capillary fill time. Pedal hair growth present. No varicosities and no lower extremity edema present bilateral.   Neruologic: Grossly intact via light touch bilateral. Vibratory intact via tuning fork bilateral. Protective threshold with Semmes Wienstein monofilament intact to all pedal sites bilateral. Patellar and Achilles deep tendon reflexes 2+ bilateral. No Babinski or clonus noted bilateral.   Musculoskeletal: No gross boney pedal deformities bilateral. No pain, crepitus, or limitation noted with foot and ankle range of motion bilateral. Muscular strength 5/5 in all groups tested bilateral.  Gait: Unassisted, Nonantalgic.    Radiographs:  None taken  Assessment & Plan:   Assessment: Pain in limb secondary to onychomycosis 1 through 5 bilateral.  Plan: Debridement of painful toenails 1 through 5 bilateral is a covered service.     Daci Stubbe T. [place], [place]

## 2017-11-23 DIAGNOSIS — M1990 Unspecified osteoarthritis, unspecified site: Secondary | ICD-10-CM | POA: Diagnosis not present

## 2017-11-23 DIAGNOSIS — H3530 Unspecified macular degeneration: Secondary | ICD-10-CM | POA: Diagnosis not present

## 2017-11-23 DIAGNOSIS — R2689 Other abnormalities of gait and mobility: Secondary | ICD-10-CM | POA: Diagnosis not present

## 2017-11-23 DIAGNOSIS — I10 Essential (primary) hypertension: Secondary | ICD-10-CM | POA: Diagnosis not present

## 2017-11-23 DIAGNOSIS — M25551 Pain in right hip: Secondary | ICD-10-CM | POA: Diagnosis not present

## 2017-11-23 DIAGNOSIS — J302 Other seasonal allergic rhinitis: Secondary | ICD-10-CM | POA: Diagnosis not present

## 2017-12-18 DIAGNOSIS — M51369 Other intervertebral disc degeneration, lumbar region without mention of lumbar back pain or lower extremity pain: Secondary | ICD-10-CM | POA: Insufficient documentation

## 2017-12-18 DIAGNOSIS — M5136 Other intervertebral disc degeneration, lumbar region: Secondary | ICD-10-CM | POA: Diagnosis not present

## 2017-12-29 DIAGNOSIS — Z23 Encounter for immunization: Secondary | ICD-10-CM | POA: Diagnosis not present

## 2018-02-13 ENCOUNTER — Ambulatory Visit (INDEPENDENT_AMBULATORY_CARE_PROVIDER_SITE_OTHER): Payer: Medicare Other | Admitting: Podiatry

## 2018-02-13 ENCOUNTER — Ambulatory Visit: Payer: Medicare Other | Admitting: Podiatry

## 2018-02-13 DIAGNOSIS — M79676 Pain in unspecified toe(s): Secondary | ICD-10-CM

## 2018-02-13 DIAGNOSIS — B351 Tinea unguium: Secondary | ICD-10-CM

## 2018-02-13 NOTE — Patient Instructions (Signed)

## 2018-03-15 DIAGNOSIS — Z6827 Body mass index (BMI) 27.0-27.9, adult: Secondary | ICD-10-CM | POA: Diagnosis not present

## 2018-03-15 DIAGNOSIS — J181 Lobar pneumonia, unspecified organism: Secondary | ICD-10-CM | POA: Diagnosis not present

## 2018-03-15 DIAGNOSIS — I10 Essential (primary) hypertension: Secondary | ICD-10-CM | POA: Diagnosis not present

## 2018-03-17 ENCOUNTER — Encounter: Payer: Self-pay | Admitting: Podiatry

## 2018-03-17 NOTE — Progress Notes (Signed)
Subjective: Faith Bolton presents today with painful, thick toenails 1-5 b/l that he cannot cut and which interfere with daily activities.  Pain is aggravated when wearing enclosed shoe gear.  Prince Solian, MD is his PCP.   Current Outpatient Medications:  .  acetaminophen (TYLENOL) 325 MG tablet, Take 650 mg by mouth every 6 (six) hours as needed., Disp: , Rfl:  .  aspirin 81 MG tablet, Take 81 mg by mouth daily., Disp: , Rfl:  .  BENICAR 20 MG tablet, , Disp: , Rfl:  .  Calcium-Vitamin D-Vitamin K (CALCIUM + D + K PO), Take by mouth., Disp: , Rfl:  .  glucosamine-chondroitin 500-400 MG tablet, Take 1 tablet by mouth 3 (three) times daily., Disp: , Rfl:  .  Influenza vac split quadrivalent PF (FLUZONE HIGH-DOSE) 0.5 ML injection, Fluzone High-Dose 2018-2019 (PF) 180 mcg/0.5 mL intramuscular syringe  TO BE ADMINISTERED BY PHARMACIST FOR IMMUNIZATION, Disp: , Rfl:  .  loratadine (CLARITIN) 10 MG tablet, Take 10 mg by mouth daily., Disp: , Rfl:  .  Multiple Vitamins-Minerals (CENTRUM SILVER ADULT 50+ PO), Take by mouth., Disp: , Rfl:  .  omeprazole (PRILOSEC) 20 MG capsule, , Disp: , Rfl:  .  omeprazole (PRILOSEC) 20 MG capsule, omeprazole 20 mg capsule,delayed release, Disp: , Rfl:  .  predniSONE (DELTASONE) 10 MG tablet, prednisone 10 mg tablet  TAKE 1 TAB THREE TIMES A DAY FOR 2 DAYS, 1 TAB TWICE A DAY FOR 5 DAYS, 1 TAB DAILY TILL FINISHED, Disp: , Rfl:  .  propranolol (INDERAL) 10 MG tablet, , Disp: , Rfl:   Allergies  Allergen Reactions  . Ciprofloxacin Other (See Comments)    Pt does not remember   . Clindamycin/Lincomycin Diarrhea  . Diphenoxylate-Atropine   . Metronidazole     Objective:  Vascular Examination: Capillary refill time immediate x 10 digits Dorsalis pedis and Posterior tibial pulses palpable b/l Digital hair present x 10 digits Skin temperature gradient WNL b/l  Dermatological Examination: Skin with normal turgor, texture and tone b/l  Toenails 1-5  b/l discolored, thick, dystrophic with subungual debris and pain with palpation to nailbeds due to thickness of nails.  Musculoskeletal: Muscle strength 5/5 to all LE muscle groups  No gross bony deformities b/l.  No pain, crepitus or joint limitation noted with ROM.   Neurological: Sensation intact with 10 gram monofilament. Vibratory sensation intact.   Assessment: Painful onychomycosis toenails 1-5 b/l   Plan: 1. Toenails 1-5 b/l were debrided in length and girth without iatrogenic bleeding. 2. Patient to continue soft, supportive shoe gear 3. Patient to report any pedal injuries to medical professional immediately. 4. Follow up 3 months. Patient/POA to call should there be a concern in the interim.

## 2018-03-26 DIAGNOSIS — E871 Hypo-osmolality and hyponatremia: Secondary | ICD-10-CM | POA: Diagnosis not present

## 2018-03-26 DIAGNOSIS — J181 Lobar pneumonia, unspecified organism: Secondary | ICD-10-CM | POA: Diagnosis not present

## 2018-03-26 DIAGNOSIS — I10 Essential (primary) hypertension: Secondary | ICD-10-CM | POA: Diagnosis not present

## 2018-03-26 DIAGNOSIS — R5383 Other fatigue: Secondary | ICD-10-CM | POA: Diagnosis not present

## 2018-03-27 DIAGNOSIS — R5383 Other fatigue: Secondary | ICD-10-CM | POA: Diagnosis not present

## 2018-03-29 DIAGNOSIS — M545 Low back pain: Secondary | ICD-10-CM | POA: Diagnosis not present

## 2018-04-02 DIAGNOSIS — E871 Hypo-osmolality and hyponatremia: Secondary | ICD-10-CM | POA: Diagnosis not present

## 2018-06-12 ENCOUNTER — Ambulatory Visit: Payer: Medicare Other | Admitting: Podiatry

## 2019-01-11 DIAGNOSIS — Z23 Encounter for immunization: Secondary | ICD-10-CM | POA: Diagnosis not present

## 2019-03-27 DIAGNOSIS — I8311 Varicose veins of right lower extremity with inflammation: Secondary | ICD-10-CM | POA: Diagnosis not present

## 2019-03-27 DIAGNOSIS — I8312 Varicose veins of left lower extremity with inflammation: Secondary | ICD-10-CM | POA: Diagnosis not present

## 2019-03-27 DIAGNOSIS — Z85828 Personal history of other malignant neoplasm of skin: Secondary | ICD-10-CM | POA: Diagnosis not present

## 2019-03-27 DIAGNOSIS — I872 Venous insufficiency (chronic) (peripheral): Secondary | ICD-10-CM | POA: Diagnosis not present

## 2019-04-02 DIAGNOSIS — M48061 Spinal stenosis, lumbar region without neurogenic claudication: Secondary | ICD-10-CM | POA: Diagnosis not present

## 2019-04-02 DIAGNOSIS — R2689 Other abnormalities of gait and mobility: Secondary | ICD-10-CM | POA: Diagnosis not present

## 2019-04-02 DIAGNOSIS — R110 Nausea: Secondary | ICD-10-CM | POA: Diagnosis not present

## 2019-04-02 DIAGNOSIS — E871 Hypo-osmolality and hyponatremia: Secondary | ICD-10-CM | POA: Diagnosis not present

## 2019-04-02 DIAGNOSIS — J302 Other seasonal allergic rhinitis: Secondary | ICD-10-CM | POA: Diagnosis not present

## 2019-04-02 DIAGNOSIS — H3530 Unspecified macular degeneration: Secondary | ICD-10-CM | POA: Diagnosis not present

## 2019-04-02 DIAGNOSIS — M25551 Pain in right hip: Secondary | ICD-10-CM | POA: Diagnosis not present

## 2019-04-02 DIAGNOSIS — I10 Essential (primary) hypertension: Secondary | ICD-10-CM | POA: Diagnosis not present

## 2019-04-02 DIAGNOSIS — I872 Venous insufficiency (chronic) (peripheral): Secondary | ICD-10-CM | POA: Diagnosis not present

## 2019-04-02 DIAGNOSIS — R002 Palpitations: Secondary | ICD-10-CM | POA: Diagnosis not present

## 2019-04-02 DIAGNOSIS — M1990 Unspecified osteoarthritis, unspecified site: Secondary | ICD-10-CM | POA: Diagnosis not present

## 2019-04-03 DIAGNOSIS — I10 Essential (primary) hypertension: Secondary | ICD-10-CM | POA: Diagnosis not present

## 2019-07-16 DIAGNOSIS — M549 Dorsalgia, unspecified: Secondary | ICD-10-CM | POA: Diagnosis not present

## 2019-07-16 DIAGNOSIS — M47894 Other spondylosis, thoracic region: Secondary | ICD-10-CM | POA: Diagnosis not present

## 2019-11-18 DIAGNOSIS — M47894 Other spondylosis, thoracic region: Secondary | ICD-10-CM | POA: Diagnosis not present

## 2019-11-18 DIAGNOSIS — N6320 Unspecified lump in the left breast, unspecified quadrant: Secondary | ICD-10-CM | POA: Diagnosis not present

## 2019-11-18 DIAGNOSIS — M25551 Pain in right hip: Secondary | ICD-10-CM | POA: Diagnosis not present

## 2019-12-05 ENCOUNTER — Other Ambulatory Visit: Payer: Self-pay

## 2019-12-05 ENCOUNTER — Encounter (HOSPITAL_COMMUNITY): Payer: Self-pay

## 2019-12-05 ENCOUNTER — Emergency Department (HOSPITAL_COMMUNITY): Payer: Medicare Other

## 2019-12-05 ENCOUNTER — Inpatient Hospital Stay (HOSPITAL_COMMUNITY)
Admission: EM | Admit: 2019-12-05 | Discharge: 2019-12-10 | DRG: 085 | Disposition: A | Discharge status: Skilled Nursing Facility | Payer: Medicare Other | Attending: Family Medicine | Admitting: Family Medicine

## 2019-12-05 DIAGNOSIS — S82845A Nondisplaced bimalleolar fracture of left lower leg, initial encounter for closed fracture: Secondary | ICD-10-CM | POA: Diagnosis not present

## 2019-12-05 DIAGNOSIS — E870 Hyperosmolality and hypernatremia: Secondary | ICD-10-CM | POA: Diagnosis not present

## 2019-12-05 DIAGNOSIS — R778 Other specified abnormalities of plasma proteins: Secondary | ICD-10-CM | POA: Diagnosis present

## 2019-12-05 DIAGNOSIS — Z7401 Bed confinement status: Secondary | ICD-10-CM | POA: Diagnosis not present

## 2019-12-05 DIAGNOSIS — S065X0A Traumatic subdural hemorrhage without loss of consciousness, initial encounter: Secondary | ICD-10-CM | POA: Diagnosis present

## 2019-12-05 DIAGNOSIS — I10 Essential (primary) hypertension: Secondary | ICD-10-CM | POA: Diagnosis present

## 2019-12-05 DIAGNOSIS — S82842A Displaced bimalleolar fracture of left lower leg, initial encounter for closed fracture: Secondary | ICD-10-CM | POA: Diagnosis present

## 2019-12-05 DIAGNOSIS — W1839XA Other fall on same level, initial encounter: Secondary | ICD-10-CM | POA: Diagnosis present

## 2019-12-05 DIAGNOSIS — M810 Age-related osteoporosis without current pathological fracture: Secondary | ICD-10-CM | POA: Diagnosis present

## 2019-12-05 DIAGNOSIS — G9341 Metabolic encephalopathy: Secondary | ICD-10-CM | POA: Diagnosis not present

## 2019-12-05 DIAGNOSIS — Z043 Encounter for examination and observation following other accident: Secondary | ICD-10-CM | POA: Diagnosis not present

## 2019-12-05 DIAGNOSIS — R079 Chest pain, unspecified: Secondary | ICD-10-CM | POA: Diagnosis not present

## 2019-12-05 DIAGNOSIS — I609 Nontraumatic subarachnoid hemorrhage, unspecified: Secondary | ICD-10-CM | POA: Diagnosis not present

## 2019-12-05 DIAGNOSIS — Y92009 Unspecified place in unspecified non-institutional (private) residence as the place of occurrence of the external cause: Secondary | ICD-10-CM

## 2019-12-05 DIAGNOSIS — I69828 Other speech and language deficits following other cerebrovascular disease: Secondary | ICD-10-CM | POA: Diagnosis not present

## 2019-12-05 DIAGNOSIS — Z9181 History of falling: Secondary | ICD-10-CM | POA: Diagnosis not present

## 2019-12-05 DIAGNOSIS — S065XAA Traumatic subdural hemorrhage with loss of consciousness status unknown, initial encounter: Secondary | ICD-10-CM

## 2019-12-05 DIAGNOSIS — Y92002 Bathroom of unspecified non-institutional (private) residence single-family (private) house as the place of occurrence of the external cause: Secondary | ICD-10-CM

## 2019-12-05 DIAGNOSIS — Z66 Do not resuscitate: Secondary | ICD-10-CM | POA: Diagnosis present

## 2019-12-05 DIAGNOSIS — Z79899 Other long term (current) drug therapy: Secondary | ICD-10-CM | POA: Diagnosis not present

## 2019-12-05 DIAGNOSIS — M6281 Muscle weakness (generalized): Secondary | ICD-10-CM | POA: Diagnosis not present

## 2019-12-05 DIAGNOSIS — S065X0D Traumatic subdural hemorrhage without loss of consciousness, subsequent encounter: Secondary | ICD-10-CM | POA: Diagnosis not present

## 2019-12-05 DIAGNOSIS — K219 Gastro-esophageal reflux disease without esophagitis: Secondary | ICD-10-CM | POA: Diagnosis not present

## 2019-12-05 DIAGNOSIS — S066X0A Traumatic subarachnoid hemorrhage without loss of consciousness, initial encounter: Secondary | ICD-10-CM | POA: Diagnosis present

## 2019-12-05 DIAGNOSIS — R5381 Other malaise: Secondary | ICD-10-CM | POA: Diagnosis not present

## 2019-12-05 DIAGNOSIS — E871 Hypo-osmolality and hyponatremia: Secondary | ICD-10-CM | POA: Diagnosis present

## 2019-12-05 DIAGNOSIS — Z7982 Long term (current) use of aspirin: Secondary | ICD-10-CM

## 2019-12-05 DIAGNOSIS — W19XXXA Unspecified fall, initial encounter: Secondary | ICD-10-CM

## 2019-12-05 DIAGNOSIS — J320 Chronic maxillary sinusitis: Secondary | ICD-10-CM | POA: Diagnosis not present

## 2019-12-05 DIAGNOSIS — G8911 Acute pain due to trauma: Secondary | ICD-10-CM | POA: Diagnosis not present

## 2019-12-05 DIAGNOSIS — I6200 Nontraumatic subdural hemorrhage, unspecified: Secondary | ICD-10-CM | POA: Diagnosis not present

## 2019-12-05 DIAGNOSIS — R0902 Hypoxemia: Secondary | ICD-10-CM | POA: Diagnosis not present

## 2019-12-05 DIAGNOSIS — Z20822 Contact with and (suspected) exposure to covid-19: Secondary | ICD-10-CM | POA: Diagnosis present

## 2019-12-05 DIAGNOSIS — R509 Fever, unspecified: Secondary | ICD-10-CM

## 2019-12-05 DIAGNOSIS — R519 Headache, unspecified: Secondary | ICD-10-CM | POA: Diagnosis not present

## 2019-12-05 DIAGNOSIS — M5136 Other intervertebral disc degeneration, lumbar region: Secondary | ICD-10-CM | POA: Diagnosis not present

## 2019-12-05 DIAGNOSIS — M2550 Pain in unspecified joint: Secondary | ICD-10-CM | POA: Diagnosis not present

## 2019-12-05 DIAGNOSIS — J189 Pneumonia, unspecified organism: Secondary | ICD-10-CM | POA: Diagnosis not present

## 2019-12-05 DIAGNOSIS — S82842D Displaced bimalleolar fracture of left lower leg, subsequent encounter for closed fracture with routine healing: Secondary | ICD-10-CM | POA: Diagnosis not present

## 2019-12-05 DIAGNOSIS — M7989 Other specified soft tissue disorders: Secondary | ICD-10-CM | POA: Diagnosis not present

## 2019-12-05 DIAGNOSIS — R2681 Unsteadiness on feet: Secondary | ICD-10-CM | POA: Diagnosis not present

## 2019-12-05 DIAGNOSIS — S066X0D Traumatic subarachnoid hemorrhage without loss of consciousness, subsequent encounter: Secondary | ICD-10-CM | POA: Diagnosis not present

## 2019-12-05 DIAGNOSIS — R41841 Cognitive communication deficit: Secondary | ICD-10-CM | POA: Diagnosis not present

## 2019-12-05 DIAGNOSIS — R7989 Other specified abnormal findings of blood chemistry: Secondary | ICD-10-CM | POA: Diagnosis present

## 2019-12-05 DIAGNOSIS — R52 Pain, unspecified: Secondary | ICD-10-CM | POA: Diagnosis not present

## 2019-12-05 DIAGNOSIS — J9811 Atelectasis: Secondary | ICD-10-CM | POA: Diagnosis not present

## 2019-12-05 DIAGNOSIS — R262 Difficulty in walking, not elsewhere classified: Secondary | ICD-10-CM | POA: Diagnosis not present

## 2019-12-05 DIAGNOSIS — M1990 Unspecified osteoarthritis, unspecified site: Secondary | ICD-10-CM | POA: Diagnosis not present

## 2019-12-05 LAB — URINALYSIS, ROUTINE W REFLEX MICROSCOPIC
Bilirubin Urine: NEGATIVE
Glucose, UA: NEGATIVE mg/dL
Hgb urine dipstick: NEGATIVE
Ketones, ur: NEGATIVE mg/dL
Leukocytes,Ua: NEGATIVE
Nitrite: NEGATIVE
Protein, ur: 30 mg/dL — AB
Specific Gravity, Urine: 1.009 (ref 1.005–1.030)
pH: 7 (ref 5.0–8.0)

## 2019-12-05 LAB — COMPREHENSIVE METABOLIC PANEL
ALT: 17 U/L (ref 0–44)
AST: 42 U/L — ABNORMAL HIGH (ref 15–41)
Albumin: 3.6 g/dL (ref 3.5–5.0)
Alkaline Phosphatase: 95 U/L (ref 38–126)
Anion gap: 13 (ref 5–15)
BUN: 9 mg/dL (ref 8–23)
CO2: 24 mmol/L (ref 22–32)
Calcium: 10 mg/dL (ref 8.9–10.3)
Chloride: 89 mmol/L — ABNORMAL LOW (ref 98–111)
Creatinine, Ser: 0.81 mg/dL (ref 0.44–1.00)
GFR calc Af Amer: >60 mL/min (ref >60)
GFR calc non Af Amer: >60 mL/min (ref >60)
Glucose, Bld: 169 mg/dL — ABNORMAL HIGH (ref 70–99)
Potassium: 4.7 mmol/L (ref 3.5–5.1)
Sodium: 126 mmol/L — ABNORMAL LOW (ref 135–145)
Total Bilirubin: 1 mg/dL (ref 0.3–1.2)
Total Protein: 6.9 g/dL (ref 6.5–8.1)

## 2019-12-05 LAB — CBC WITH DIFFERENTIAL/PLATELET
Abs Immature Granulocytes: 0.05 10*3/uL (ref 0.00–0.07)
Basophils Absolute: 0 10*3/uL (ref 0.0–0.1)
Basophils Relative: 0 %
Eosinophils Absolute: 0.1 10*3/uL (ref 0.0–0.5)
Eosinophils Relative: 1 %
HCT: 38.7 % (ref 36.0–46.0)
Hemoglobin: 13.2 g/dL (ref 12.0–15.0)
Immature Granulocytes: 1 %
Lymphocytes Relative: 11 %
Lymphs Abs: 1 10*3/uL (ref 0.7–4.0)
MCH: 30.8 pg (ref 26.0–34.0)
MCHC: 34.1 g/dL (ref 30.0–36.0)
MCV: 90.2 fL (ref 80.0–100.0)
Monocytes Absolute: 0.8 10*3/uL (ref 0.1–1.0)
Monocytes Relative: 9 %
Neutro Abs: 7.1 10*3/uL (ref 1.7–7.7)
Neutrophils Relative %: 78 %
Platelets: 282 10*3/uL (ref 150–400)
RBC: 4.29 MIL/uL (ref 3.87–5.11)
RDW: 12.3 % (ref 11.5–15.5)
WBC: 9.1 10*3/uL (ref 4.0–10.5)
nRBC: 0 % (ref 0.0–0.2)

## 2019-12-05 LAB — RESPIRATORY PANEL BY RT PCR (FLU A&B, COVID)
Influenza A by PCR: NEGATIVE
Influenza B by PCR: NEGATIVE
SARS Coronavirus 2 by RT PCR: NEGATIVE

## 2019-12-05 LAB — TROPONIN I (HIGH SENSITIVITY)
Troponin I (High Sensitivity): 199 ng/L (ref <18)
Troponin I (High Sensitivity): 248 ng/L (ref <18)

## 2019-12-05 MED ORDER — ACETAMINOPHEN 325 MG PO TABS
650.0000 mg | ORAL_TABLET | Freq: Four times a day (QID) | ORAL | Status: DC | PRN
  Administered 2019-12-06 – 2019-12-10 (×5): 650 mg via ORAL
  Filled 2019-12-05 (×5): qty 2

## 2019-12-05 MED ORDER — ONDANSETRON HCL 4 MG/2ML IJ SOLN
4.0000 mg | Freq: Four times a day (QID) | INTRAMUSCULAR | Status: DC | PRN
  Administered 2019-12-07: 4 mg via INTRAVENOUS
  Filled 2019-12-05: qty 2

## 2019-12-05 MED ORDER — ONDANSETRON HCL 4 MG PO TABS: 4.0000 mg | ORAL_TABLET | Freq: Four times a day (QID) | ORAL | Status: DC | PRN

## 2019-12-05 MED ORDER — SENNOSIDES-DOCUSATE SODIUM 8.6-50 MG PO TABS: 1.0000 | ORAL_TABLET | Freq: Every evening | ORAL | Status: DC | PRN

## 2019-12-05 MED ORDER — ACETAMINOPHEN 650 MG RE SUPP: 650.0000 mg | Freq: Four times a day (QID) | RECTAL | Status: DC | PRN

## 2019-12-05 MED ORDER — SODIUM CHLORIDE 0.9% FLUSH
3.0000 mL | Freq: Two times a day (BID) | INTRAVENOUS | Status: DC
  Administered 2019-12-06 – 2019-12-10 (×9): 3 mL via INTRAVENOUS

## 2019-12-05 MED ORDER — SODIUM CHLORIDE 0.9 % IV SOLN: Freq: Once | INTRAVENOUS | Status: AC

## 2019-12-05 NOTE — ED Triage Notes (Signed)
Pt to rm 34 from [facility]. Pt lives alone and fell while walking to bathroom today.  Pt states her left foot just gave out on her and she fell onto floor hitting back of head on wall. Pt c/o pain in left foot/ankle, ankle is swollen and tender to touch.

## 2019-12-05 NOTE — H&P (Signed)
History and Physical    Faith Bolton MVE:[id number] DOB: September 28, 1919 DOA: 12/05/2019  PCP: Prince Solian, MD  Patient coming from: Home via EMS  I have personally briefly reviewed patient's old medical records in [place]  Chief Complaint: Fall at home with injury to left ankle and head  HPI: Faith Bolton is a 86 y.o. female with medical history significant for hypertension who presents to the ED for evaluation after fall at home.  Patient states she normally ambulates with the use of a walker.  Yesterday she fell after feeling as if her left ankle gave out.  She says she did hit her head yesterday but did not lose consciousness.  She was having difficulty walking and EMS were called to her home however she did not want to be brought to the hospital at that time.    Today she fell again while walking to the bathroom using her walker when her left foot gave out again.  She says she did hit the back of her head on the wall when she fell.  She says she did not lose consciousness.  She was unable to stand up on her home due to significant left ankle pain and swelling.  EMS were again called and she was brought to the ED for further evaluation.  She reports 3 episodes of nonspecific left-sided chest pain described as a "twinge" sensation which was nonradiating.  She reports chronic palpitations with irregular heartbeat for which she takes propranolol.  She denies any history of atrial fibrillation.  She says she does take aspirin 81 mg daily.  She has had several weeks of nausea without emesis.  She reports her decreased appetite and poor oral intake recently as well.  She has chronic back pain for which she has recently been started on tramadol.  She says she has been constipated recently and denies any diarrhea.  She denies any dysuria.  ED Course:  Initial vitals showed BP 148/108, pulse 92, RR 18, temp 98.2 Fahrenheit, SPO2 93% on room air.  Labs show sodium 126 (no recent for  comparison), potassium 4.7, bicarb 24, BUN 9, creatinine 0.81, serum glucose 169, AST 42, ALT 17, alk phos 95, total bilirubin 1.0, WBC 9.1, hemoglobin 13.2, platelets 282,000.  High-sensitivity troponin I is 199.  Urinalysis shows negative nitrites, negative leukocytes, 0-5 RBCs and WBCs/hpf, rare bacteria microscopy.  Respiratory PCR panel was collected and pending.  CT head and cervical spine showed mixed subdural and subarachnoid hemorrhage over the posterior left hemisphere without associated mass-effect.  No acute fracture of the skull or C-spine noted.  2 view chest x-ray showed low lung volumes with bibasilar atelectasis.  Pelvic x-ray was negative for acute osseous abnormality.  Left ankle x-ray showed bimalleolar ankle fracture without gross subluxation.  EDP discussed with on-call neurosurgery who recommended repeat CT in a.m. without any acute emergent intervention as patient is currently awake and alert.  EDP also discussed with on-call orthopedics who recommended splint and nonweightbearing to ankle and will follow in a.m.  The hospitalist service was consulted to admit for further evaluation and management.  Review of Systems: All systems reviewed and are negative except as documented in history of present illness above.   Past Medical History:  Diagnosis Date  . Cancer ([place])   . Hypertension   . Macular degeneration (senile) of retina, unspecified   . Osteoarthrosis, unspecified whether generalized or localized, unspecified site   . Reflux     Past Surgical History:  Procedure Laterality Date  . APPENDECTOMY    . TONSILLECTOMY      Social History:  reports that she has never smoked. She has never used smokeless tobacco. She reports that she does not drink alcohol and does not use drugs.  Allergies  Allergen Reactions  . Ciprofloxacin Other (See Comments)    Pt does not remember   . Clindamycin/Lincomycin Diarrhea  . Diphenoxylate-Atropine   . Metronidazole       No family history on file.   Prior to Admission medications   Medication Sig Start Date End Date Taking? Authorizing Provider  acetaminophen (TYLENOL) 325 MG tablet Take 650 mg by mouth every 6 (six) hours as needed.    [provider]  aspirin 81 MG tablet Take 81 mg by mouth daily.    [provider]  BENICAR 20 MG tablet  06/13/13   [provider]  Calcium-Vitamin D-Vitamin K (CALCIUM + D + K PO) Take by mouth.    [provider]  glucosamine-chondroitin 500-400 MG tablet Take 1 tablet by mouth 3 (three) times daily.    [provider]  Influenza vac split quadrivalent PF (FLUZONE HIGH-DOSE) 0.5 ML injection Fluzone High-Dose 2018-2019 (PF) 180 mcg/0.5 mL intramuscular syringe  TO BE ADMINISTERED BY PHARMACIST FOR IMMUNIZATION    [provider]  loratadine (CLARITIN) 10 MG tablet Take 10 mg by mouth daily.    [provider]  Multiple Vitamins-Minerals (CENTRUM SILVER ADULT 50+ PO) Take by mouth.    [provider]  omeprazole (PRILOSEC) 20 MG capsule  06/13/13   [provider]  omeprazole (PRILOSEC) 20 MG capsule omeprazole 20 mg capsule,delayed release    [provider]  predniSONE (DELTASONE) 10 MG tablet prednisone 10 mg tablet  TAKE 1 TAB THREE TIMES A DAY FOR 2 DAYS, 1 TAB TWICE A DAY FOR 5 DAYS, 1 TAB DAILY TILL FINISHED    [provider]  propranolol (INDERAL) 10 MG tablet  05/16/13   [provider]    Physical Exam: Vitals:   12/05/19 1800 12/05/19 1815 12/05/19 1830 12/05/19 1845  BP: (!) 148/108 (!) 190/111 (!) 178/114 (!) 162/91  Pulse: 92 95 95 91  Resp:  (!) 23 (!) 25 (!) 25  Temp:      TempSrc:      SpO2: 93% 95% 93% 95%  Weight: 74.8 kg     Height: 5' 4"  (1.626 m)      Constitutional: Elderly woman resting supine in bed, NAD, calm, comfortable Eyes: PERRL, EOMI, lids and conjunctivae normal ENMT: Mucous membranes are dry. Posterior pharynx clear of  any exudate or lesions.Normal dentition.  Neck: normal, supple, no masses. Respiratory: clear to auscultation bilaterally, no wheezing, no crackles. Normal respiratory effort. No accessory muscle use.  Cardiovascular: Regular rate and rhythm, no murmurs / rubs / gallops. No extremity edema. Abdomen: no tenderness, no masses palpated. Bowel sounds positive.  Musculoskeletal: Left ankle in splint and wrapped.  ROM of left ankle diminished due to fracture otherwise ROM intact all other extremities. Skin: no rashes, lesions, ulcers. No induration Neurologic: CN 2-12 grossly intact. Sensation intact, Strength 5/5 in all 4 hips that have left ankle due to fracture.  Psychiatric: Normal judgment and insight. Alert and oriented x 3. Normal mood.   Labs on Admission: I have personally reviewed following labs and imaging studies  CBC: Recent Labs  Lab 12/05/19 1815  WBC 9.1  NEUTROABS 7.1  HGB 13.2  HCT 38.7  MCV 90.2  PLT 782   Basic Metabolic Panel: Recent Labs  Lab 12/05/19 1815  NA 126*  K 4.7  CL 89*  CO2 24  GLUCOSE 169*  BUN 9  CREATININE 0.81  CALCIUM 10.0   GFR: Estimated Creatinine Clearance: 38.4 mL/min (by C-G formula based on SCr of 0.81 mg/dL). Liver Function Tests: Recent Labs  Lab 12/05/19 1815  AST 42*  ALT 17  ALKPHOS 95  BILITOT 1.0  PROT 6.9  ALBUMIN 3.6   No results for input(s): LIPASE, AMYLASE in the last 168 hours. No results for input(s): AMMONIA in the last 168 hours. Coagulation Profile: No results for input(s): INR, PROTIME in the last 168 hours. Cardiac Enzymes: No results for input(s): CKTOTAL, CKMB, CKMBINDEX, TROPONINI in the last 168 hours. BNP (last 3 results) No results for input(s): PROBNP in the last 8760 hours. HbA1C: No results for input(s): HGBA1C in the last 72 hours. CBG: No results for input(s): GLUCAP in the last 168 hours. Lipid Profile: No results for input(s): CHOL, HDL, LDLCALC, TRIG, CHOLHDL, LDLDIRECT in the last 72  hours. Thyroid Function Tests: No results for input(s): TSH, T4TOTAL, FREET4, T3FREE, THYROIDAB in the last 72 hours. Anemia Panel: No results for input(s): VITAMINB12, FOLATE, FERRITIN, TIBC, IRON, RETICCTPCT in the last 72 hours. Urine analysis:    Component Value Date/Time   COLORURINE YELLOW 12/05/2019 1925   APPEARANCEUR HAZY (A) 12/05/2019 1925   LABSPEC 1.009 12/05/2019 1925   PHURINE 7.0 12/05/2019 1925   GLUCOSEU NEGATIVE 12/05/2019 1925   HGBUR NEGATIVE 12/05/2019 1925   BILIRUBINUR NEGATIVE 12/05/2019 1925   KETONESUR NEGATIVE 12/05/2019 1925   PROTEINUR 30 (A) 12/05/2019 1925   UROBILINOGEN 0.2 01/28/2007 1741   NITRITE NEGATIVE 12/05/2019 1925   LEUKOCYTESUR NEGATIVE 12/05/2019 1925    Radiological Exams on Admission: DG Chest 2 View  Result Date: 12/05/2019 CLINICAL DATA:  86 year old post fall walking to the bathroom today. EXAM: CHEST - 2 VIEW COMPARISON:  Remote radiograph 01/28/2007 FINDINGS: Lung volumes are low. Upper normal heart size. Mild aortic tortuosity. Aortic atherosclerosis. Subsegmental opacities in the lung bases typical of atelectasis. Possible small pleural effusions. No pneumothorax or confluent airspace disease. No acute osseous abnormalities are seen. Scoliotic curvature of the upper lumbar spine. IMPRESSION: Low lung volumes with bibasilar atelectasis and possible small pleural effusions. Aortic Atherosclerosis (ICD10-I70.0). Electronically Signed   By: Keith Rake M.D.   On: 12/05/2019 19:40   DG Pelvis 1-2 Views  Result Date: 12/05/2019 CLINICAL DATA:  [place] while going to bathroom EXAM: PELVIS - 1-2 VIEW COMPARISON:  None. FINDINGS: SI joints are non widened. Pubic symphysis and rami appear intact. No fracture or malalignment. Mild to moderate degenerative change of the hips. IMPRESSION: No acute osseous abnormality. Electronically Signed   By: Donavan Foil M.D.   On: 12/05/2019 19:39   DG Ankle Complete Left  Result Date:  12/05/2019 CLINICAL DATA:  Fall.  Left foot and ankle pain.  Ankle swelling. EXAM: LEFT ANKLE COMPLETE - 3+ VIEW COMPARISON:  None. FINDINGS: Three views study shows an oblique fracture of the distal fibula, proximal to the tibiotalar joint. Associated fracture of the medial malleolus evident. Lateral film shows no definite posterior lip fracture of the distal tibia. No evidence for subluxation. Bones are diffusely demineralized. Overlying soft tissue swelling evident. IMPRESSION: Bimalleolar ankle fracture without gross subluxation. Electronically Signed   By: Misty Stanley M.D.   On: 12/05/2019 19:40    EKG: Independently reviewed. Normal sinus rhythm without acute ischemic changes.  No prior for comparison.  Assessment/Plan Principal Problem:   Subdural hemorrhage following injury, no loss of consciousness (HCC) Active Problems:   Subarachnoid hemorrhage following injury, no loss of consciousness (HCC)   Bimalleolar fracture of left ankle, closed, initial encounter   Fall at home, initial encounter   Hyponatremia   Elevated troponin   Hypertension  Faith Bolton is a 86 y.o. female with medical history significant for hypertension who is admitted after a fall at home resulting in subdural and subarachnoid hemorrhages and left ankle fracture.  Subdural and subarachnoid hemorrhage over posterior left hemisphere: Suspect secondary to traumatic injury from fall at home.  She is currently awake, alert, and oriented without focal neurological deficit on admission.  Neurology consulted by EDP and recommend follow-up CT head in a.m. -Repeat CT head in a.m. -Hold home aspirin -Continue neurochecks  Elevated troponin: Mildly elevated troponin of 199 >> 248 on admission.  Patient reports transient nonspecific nonradiating left-sided chest pain prior to arrival.  She also reports chronic palpitations.  EKG is without any acute ischemic changes. -Cycle cardiac enzymes -No antiplatelet or  anticoagulation at this time due to subdural and subarachnoid hemorrhage -Monitor on telemetry  Hyponatremia: Sodium 126 on admission.  Suspect hypovolemic hyponatremia.  Patient currently receiving IV NS @ 125 mL/hr.  Check urine sodium and osmolality, serum osmolality.  Repeat labs in a.m.  Bimalleolar left ankle fracture: Orthopedics consulted by EDP and will see in a.m.  Splint placed with recommendation of nonweightbearing to ankle.  Fall at home: History suggestive of mechanical fall at home despite using walker.  Denies any syncope.  Reports nonspecific chest pain as above. -Request PT/OT eval  Hypertension: Patient states she is no longer on antihypertensives except for propranolol which is used for unspecified palpitations.  DVT prophylaxis: SCDs Code Status: DNR, confirmed with patient Family Communication: Discussed with patient's daughter at bedside Disposition Plan: From home, likely SNF on discharge Consults called: Neurosurgery, orthopedics consulted by EDP Admission status:  Status is: Inpatient  Remains inpatient appropriate because:Ongoing diagnostic testing needed not appropriate for outpatient work up, Unsafe d/c plan and Inpatient level of care appropriate due to severity of illness, follow-up SDH/SAH imaging, orthopedic evaluation, PT/OT eval.  Dispo: The patient is from: Home              Anticipated d/c is to: SNF              Anticipated d/c date is: 2 days              Patient currently is not medically stable to d/c.   Zada Finders MD [facility]  If 7PM-7AM, please contact night-coverage [email or web address]  12/05/2019, 9:43 PM

## 2019-12-05 NOTE — ED Provider Notes (Signed)
[facility] EMERGENCY DEPARTMENT Provider Note   CSN: [id number] Arrival date & time: 12/05/19  1743     History Chief Complaint  Patient presents with  . Fall    Faith Bolton is a 86 y.o. female.  Pt presents to the ED today with a fall.  Pt lives alone and fell yesterday and fell again today.  She said her left foot gave out on her and she fell.  She c/o left ankle pain.  She did hit her head, but is not on blood thinners.  No loc.  No cp or sob.        Past Medical History:  Diagnosis Date  . Cancer ([place])   . Hypertension   . Macular degeneration (senile) of retina, unspecified   . Osteoarthrosis, unspecified whether generalized or localized, unspecified site   . Reflux     Patient Active Problem List   Diagnosis Date Noted  . Subdural hemorrhage following injury, no loss of consciousness ([place]) 12/05/2019  . Subarachnoid hemorrhage following injury, no loss of consciousness ([place]) 12/05/2019  . Bimalleolar fracture of left ankle, closed, initial encounter 12/05/2019  . Fall at home, initial encounter 12/05/2019  . Hyponatremia 12/05/2019  . Elevated troponin 12/05/2019  . Hypertension   . Degeneration of lumbar intervertebral disc 12/18/2017    Past Surgical History:  Procedure Laterality Date  . APPENDECTOMY    . TONSILLECTOMY       OB History   No obstetric history on file.     No family history on file.  Social History   Tobacco Use  . Smoking status: Never Smoker  . Smokeless tobacco: Never Used  Substance Use Topics  . Alcohol use: No  . Drug use: No    Home Medications Prior to Admission medications   Medication Sig Start Date End Date Taking? Authorizing Provider  acetaminophen (TYLENOL) 325 MG tablet Take 650 mg by mouth every 6 (six) hours as needed.    [provider]  aspirin 81 MG tablet Take 81 mg by mouth daily.    [provider]  BENICAR 20 MG tablet  06/13/13   [provider]    Calcium-Vitamin D-Vitamin K (CALCIUM + D + K PO) Take by mouth.    [provider]  glucosamine-chondroitin 500-400 MG tablet Take 1 tablet by mouth 3 (three) times daily.    [provider]  Influenza vac split quadrivalent PF (FLUZONE HIGH-DOSE) 0.5 ML injection Fluzone High-Dose 2018-2019 (PF) 180 mcg/0.5 mL intramuscular syringe  TO BE ADMINISTERED BY PHARMACIST FOR IMMUNIZATION    [provider]  loratadine (CLARITIN) 10 MG tablet Take 10 mg by mouth daily.    [provider]  Multiple Vitamins-Minerals (CENTRUM SILVER ADULT 50+ PO) Take by mouth.    [provider]  omeprazole (PRILOSEC) 20 MG capsule  06/13/13   [provider]  omeprazole (PRILOSEC) 20 MG capsule omeprazole 20 mg capsule,delayed release    [provider]  predniSONE (DELTASONE) 10 MG tablet prednisone 10 mg tablet  TAKE 1 TAB THREE TIMES A DAY FOR 2 DAYS, 1 TAB TWICE A DAY FOR 5 DAYS, 1 TAB DAILY TILL FINISHED    [provider]  propranolol (INDERAL) 10 MG tablet  05/16/13   [provider]    Allergies    Ciprofloxacin, Clindamycin/lincomycin, Diphenoxylate-atropine, and Metronidazole  Review of Systems   Review of Systems  Musculoskeletal:       Left ankle pain  All  other systems reviewed and are negative.   Physical Exam Updated Vital Signs BP (!) 162/91   Pulse 91   Temp 98.2 F (36.8 C) (Oral)   Resp (!) 25   Ht 5\' 4"  (1.626 m)   Wt 74.8 kg   SpO2 95%   BMI 28.32 kg/m   Physical Exam Vitals and nursing note reviewed.  Constitutional:      Appearance: Normal appearance.  HENT:     Head: Normocephalic and atraumatic.     Right Ear: External ear normal.     Left Ear: External ear normal.     Nose: Nose normal.     Mouth/Throat:     Mouth: Mucous membranes are moist.     Pharynx: Oropharynx is clear.  Eyes:     Extraocular Movements: Extraocular movements intact.     Pupils: Pupils are equal, round, and  reactive to light.  Cardiovascular:     Rate and Rhythm: Normal rate and regular rhythm.     Pulses: Normal pulses.     Heart sounds: Normal heart sounds.  Pulmonary:     Effort: Pulmonary effort is normal.     Breath sounds: Normal breath sounds.  Abdominal:     General: Abdomen is flat. Bowel sounds are normal.     Palpations: Abdomen is soft.  Musculoskeletal:     Cervical back: Normal range of motion and neck supple.     Left ankle: Swelling present. Tenderness present.  Neurological:     General: No focal deficit present.     Mental Status: She is alert and oriented to person, place, and time.     ED Results / Procedures / Treatments   Labs (all labs ordered are listed, but only abnormal results are displayed) Labs Reviewed  COMPREHENSIVE METABOLIC PANEL - Abnormal; Notable for the following components:      Result Value   Sodium 126 (*)    Chloride 89 (*)    Glucose, Bld 169 (*)    AST 42 (*)    All other components within normal limits  URINALYSIS, ROUTINE W REFLEX MICROSCOPIC - Abnormal; Notable for the following components:   APPearance HAZY (*)    Protein, ur 30 (*)    Bacteria, UA RARE (*)    All other components within normal limits  TROPONIN I (HIGH SENSITIVITY) - Abnormal; Notable for the following components:   Troponin I (High Sensitivity) 199 (*)    All other components within normal limits  RESPIRATORY PANEL BY RT PCR (FLU A&B, COVID)  CBC WITH DIFFERENTIAL/PLATELET  TROPONIN I (HIGH SENSITIVITY)    EKG EKG Interpretation  Date/Time:  Thursday December 05 2019 18:12:34 EDT Ventricular Rate:  94 PR Interval:    QRS Duration: 86 QT Interval:  325 QTC Calculation: 407 R Axis:   -12 Text Interpretation: Sinus rhythm No significant change since last tracing Confirmed by Isla Pence [phone]) on 12/05/2019 6:13:52 PM   Radiology DG Chest 2 View  Result Date: 12/05/2019 CLINICAL DATA:  86 year old post fall walking to the bathroom today. EXAM:  CHEST - 2 VIEW COMPARISON:  Remote radiograph 01/28/2007 FINDINGS: Lung volumes are low. Upper normal heart size. Mild aortic tortuosity. Aortic atherosclerosis. Subsegmental opacities in the lung bases typical of atelectasis. Possible small pleural effusions. No pneumothorax or confluent airspace disease. No acute osseous abnormalities are seen. Scoliotic curvature of the upper lumbar spine. IMPRESSION: Low lung volumes with bibasilar atelectasis and possible small pleural effusions. Aortic Atherosclerosis (ICD10-I70.0). Electronically Signed  By: Keith Rake M.D.   On: 12/05/2019 19:40   DG Pelvis 1-2 Views  Result Date: 12/05/2019 CLINICAL DATA:  [place] while going to bathroom EXAM: PELVIS - 1-2 VIEW COMPARISON:  None. FINDINGS: SI joints are non widened. Pubic symphysis and rami appear intact. No fracture or malalignment. Mild to moderate degenerative change of the hips. IMPRESSION: No acute osseous abnormality. Electronically Signed   By: Donavan Foil M.D.   On: 12/05/2019 19:39   DG Ankle Complete Left  Result Date: 12/05/2019 CLINICAL DATA:  Fall.  Left foot and ankle pain.  Ankle swelling. EXAM: LEFT ANKLE COMPLETE - 3+ VIEW COMPARISON:  None. FINDINGS: Three views study shows an oblique fracture of the distal fibula, proximal to the tibiotalar joint. Associated fracture of the medial malleolus evident. Lateral film shows no definite posterior lip fracture of the distal tibia. No evidence for subluxation. Bones are diffusely demineralized. Overlying soft tissue swelling evident. IMPRESSION: Bimalleolar ankle fracture without gross subluxation. Electronically Signed   By: Misty Stanley M.D.   On: 12/05/2019 19:40   CT head/c-spine:  IMPRESSION: 1. Mixed subdural and subarachnoid hemorrhage over the posterior left hemisphere without associated mass effect. 2. No acute fracture or static subluxation of the cervical spine. 3. Right pleural effusion.   Procedures Procedures (including  critical care time)  Medications Ordered in ED Medications  0.9 %  sodium chloride infusion ( Intravenous New Bag/Given 12/05/19 2011)    ED Course  I have reviewed the triage vital signs and the nursing notes.  Pertinent labs & imaging results that were available during my care of the patient were reviewed by me and considered in my medical decision making (see chart for details).    MDM Rules/Calculators/A&P                          Pt d/w NS who recommends repeat CT in the AM.  Nothing emergent to do now as pt is awake and alert.  Pt d/w Dr. Stann Mainland (ortho).  Short leg splint NWB and he will see in the morning.  Splint applied by ortho tech.  Troponin is elevated, but no cp.  EKG ok.  Pt d/w Dr. Posey Pronto ([facility]) for admission.  CRITICAL CARE Performed by: Isla Pence   Total critical care time: 30 minutes  Critical care time was exclusive of separately billable procedures and treating other patients.  Critical care was necessary to treat or prevent imminent or life-threatening deterioration.  Critical care was time spent personally by me on the following activities: development of treatment plan with patient and/or surrogate as well as nursing, discussions with consultants, evaluation of patient's response to treatment, examination of patient, obtaining history from patient or surrogate, ordering and performing treatments and interventions, ordering and review of laboratory studies, ordering and review of radiographic studies, pulse oximetry and re-evaluation of patient's condition.   Final Clinical Impression(s) / ED Diagnoses Final diagnoses:  Fall, initial encounter  Closed bimalleolar fracture of left ankle, initial encounter  SDH (subdural hematoma) (HCC)  SAH (subarachnoid hemorrhage) ([place])  Hyponatremia  Elevated troponin    Rx / DC Orders ED Discharge Orders    None       Isla Pence, MD 12/05/19 2104

## 2019-12-05 NOTE — Progress Notes (Signed)
Orthopedic Tech Progress Note Patient Details:  TIARAH SHISLER 1919/12/20 [id number]  Ortho Devices Type of Ortho Device: Post (short leg) splint Ortho Device/Splint Location: LLE Ortho Device/Splint Interventions: Application   Post Interventions Patient Tolerated: Well Instructions Provided: Care of device   Jennah Satchell E Layann Bluett 12/05/2019, 9:07 PM

## 2019-12-05 NOTE — Progress Notes (Signed)
Patient discussed with ED.  Splint and NWB to ankle at this time.  Pt getting admitted for other, non ortho, issues.  Will see with full consultation tomorrow.

## 2019-12-05 NOTE — ED Notes (Signed)
Pt to radiology via stretcher by transport. 

## 2019-12-06 ENCOUNTER — Inpatient Hospital Stay (HOSPITAL_COMMUNITY): Payer: Medicare Other

## 2019-12-06 ENCOUNTER — Encounter (HOSPITAL_COMMUNITY): Payer: Self-pay | Admitting: Internal Medicine

## 2019-12-06 DIAGNOSIS — S065X0A Traumatic subdural hemorrhage without loss of consciousness, initial encounter: Principal | ICD-10-CM

## 2019-12-06 DIAGNOSIS — R079 Chest pain, unspecified: Secondary | ICD-10-CM

## 2019-12-06 LAB — ECHOCARDIOGRAM COMPLETE
AR max vel: 2.57 cm2
AV Area VTI: 2.52 cm2
AV Area mean vel: 2.54 cm2
AV Mean grad: 4 mmHg
AV Peak grad: 8 mmHg
Ao pk vel: 1.41 m/s
Area-P 1/2: 2.17 cm2
Height: 64 in
S' Lateral: 2.3 cm
Weight: 2640 oz

## 2019-12-06 LAB — PROTIME-INR
INR: 1 (ref 0.8–1.2)
Prothrombin Time: 13 seconds (ref 11.4–15.2)

## 2019-12-06 LAB — CBC
HCT: 34.1 % — ABNORMAL LOW (ref 36.0–46.0)
Hemoglobin: 11.9 g/dL — ABNORMAL LOW (ref 12.0–15.0)
MCH: 31.6 pg (ref 26.0–34.0)
MCHC: 34.9 g/dL (ref 30.0–36.0)
MCV: 90.7 fL (ref 80.0–100.0)
Platelets: 242 10*3/uL (ref 150–400)
RBC: 3.76 MIL/uL — ABNORMAL LOW (ref 3.87–5.11)
RDW: 12.2 % (ref 11.5–15.5)
WBC: 7.9 10*3/uL (ref 4.0–10.5)
nRBC: 0 % (ref 0.0–0.2)

## 2019-12-06 LAB — BASIC METABOLIC PANEL
Anion gap: 13 (ref 5–15)
BUN: 6 mg/dL — ABNORMAL LOW (ref 8–23)
CO2: 22 mmol/L (ref 22–32)
Calcium: 9.2 mg/dL (ref 8.9–10.3)
Chloride: 91 mmol/L — ABNORMAL LOW (ref 98–111)
Creatinine, Ser: 0.74 mg/dL (ref 0.44–1.00)
GFR calc Af Amer: >60 mL/min (ref >60)
GFR calc non Af Amer: >60 mL/min (ref >60)
Glucose, Bld: 150 mg/dL — ABNORMAL HIGH (ref 70–99)
Potassium: 3.9 mmol/L (ref 3.5–5.1)
Sodium: 126 mmol/L — ABNORMAL LOW (ref 135–145)

## 2019-12-06 LAB — OSMOLALITY: Osmolality: 265 mOsm/kg — ABNORMAL LOW (ref 275–295)

## 2019-12-06 LAB — TROPONIN I (HIGH SENSITIVITY)
Troponin I (High Sensitivity): 219 ng/L (ref <18)
Troponin I (High Sensitivity): 133 ng/L (ref <18)

## 2019-12-06 MED ORDER — DOCUSATE SODIUM 100 MG PO CAPS: 100.0000 mg | ORAL_CAPSULE | Freq: Every day | ORAL | Status: DC | PRN

## 2019-12-06 MED ORDER — OXYCODONE HCL 5 MG PO TABS
5.0000 mg | ORAL_TABLET | Freq: Four times a day (QID) | ORAL | Status: DC | PRN
  Administered 2019-12-06 – 2019-12-07 (×2): 5 mg via ORAL
  Filled 2019-12-06 (×2): qty 1

## 2019-12-06 MED ORDER — PANTOPRAZOLE SODIUM 40 MG PO TBEC
40.0000 mg | DELAYED_RELEASE_TABLET | Freq: Every day | ORAL | Status: DC
  Administered 2019-12-06 – 2019-12-10 (×5): 40 mg via ORAL
  Filled 2019-12-06 (×5): qty 1

## 2019-12-06 MED ORDER — IRBESARTAN 150 MG PO TABS
150.0000 mg | ORAL_TABLET | Freq: Every day | ORAL | Status: DC
  Administered 2019-12-06 – 2019-12-10 (×5): 150 mg via ORAL
  Filled 2019-12-06 (×6): qty 1

## 2019-12-06 NOTE — TOC CAGE-AID Note (Signed)
Transition of Care [facility]) - CAGE-AID Screening   Patient Details  Name: Faith Bolton MRN: [id number] Date of Birth: 19-Aug-1919  [facility] of [facility]) CM/SW Contact:    Emeterio Reeve, [place] Phone Number: 12/06/2019, 12:45 PM   Clinical Narrative:  CSW introduced self via phone and explained role. CSW inquired about alcohol use and substance use. Pt denied alcohol use and substance use. Pt did no need resources at this time.   CAGE-AID Screening:    Have You Ever Felt You Ought to Cut Down on Your Drinking or Drug Use?: No Have People Annoyed You By Critizing Your Drinking Or Drug Use?: No Have You Felt Bad Or Guilty About Your Drinking Or Drug Use?: No Have You Ever Had a Drink or Used Drugs First Thing In The Morning to Steady Your Nerves or to Get Rid of a Hangover?: No CAGE-AID Score: 0  Substance Abuse Education Offered: Yes    Blima Ledger, [place] Social Worker [phone]

## 2019-12-06 NOTE — Consult Note (Signed)
ORTHOPAEDIC CONSULTATION  REQUESTING PHYSICIAN: Danford, Suann Larry, *  PCP:  Prince Solian, MD  Chief Complaint: Left ankle pain  HPI: Faith Bolton is a 86 y.o. female who complains of left ankle pain following a ground-level fall at home yesterday evening. At baseline she does utilize a walker. She is independent with activities of daily living and does live independently. She states she feels as though her feet became numb and they rolled up under her. This resulted in some pain there and also some headache. She presented to the emergency department where scans did show a small and stable subarachnoid hemorrhage as well as a bimalleolar ankle fracture. She was admitted to the medicine service for management of her current condition in addition to following along for therapy recommendations on placement. Currently she is resting comfortably on the medicine floor with her daughter at the bedside.  Past Medical History:  Diagnosis Date  . Cancer ([place])   . Hypertension   . Macular degeneration (senile) of retina, unspecified   . Osteoarthrosis, unspecified whether generalized or localized, unspecified site   . Reflux    Past Surgical History:  Procedure Laterality Date  . APPENDECTOMY    . TONSILLECTOMY     Social History   Socioeconomic History  . Marital status: Widowed    Spouse name: Not on file  . Number of children: Not on file  . Years of education: Not on file  . Highest education level: Not on file  Occupational History  . Not on file  Tobacco Use  . Smoking status: Never Smoker  . Smokeless tobacco: Never Used  Substance and Sexual Activity  . Alcohol use: No  . Drug use: No  . Sexual activity: Not on file  Other Topics Concern  . Not on file  Social History Narrative  . Not on file   Social Determinants of Health   Financial Resource Strain:   . Difficulty of Paying Living Expenses: Not on file  Food Insecurity:   . Worried About Ship broker in the Last Year: Not on file  . Ran Out of Food in the Last Year: Not on file  Transportation Needs:   . Lack of Transportation (Medical): Not on file  . Lack of Transportation (Non-Medical): Not on file  Physical Activity:   . Days of Exercise per Week: Not on file  . Minutes of Exercise per Session: Not on file  Stress:   . Feeling of Stress : Not on file  Social Connections:   . Frequency of Communication with Friends and Family: Not on file  . Frequency of Social Gatherings with Friends and Family: Not on file  . Attends Religious Services: Not on file  . Active Member of Clubs or Organizations: Not on file  . Attends Archivist Meetings: Not on file  . Marital Status: Not on file   No family history on file. Allergies  Allergen Reactions  . Ciprofloxacin Other (See Comments)    Pt does not remember   . Clindamycin/Lincomycin Diarrhea  . Diphenoxylate-Atropine   . Metronidazole    Prior to Admission medications   Medication Sig Start Date End Date Taking? Authorizing Provider  acetaminophen (TYLENOL) 325 MG tablet Take 650 mg by mouth every 6 (six) hours as needed for mild pain.    Yes [provider]  aspirin 81 MG tablet Take 81 mg by mouth daily.   Yes [provider]  BENICAR 20 MG tablet  Take 20 mg by mouth daily.  06/13/13  Yes [provider]  Calcium-Vitamin D-Vitamin K (CALCIUM + D + K PO) Take 1 tablet by mouth daily.    Yes [provider]  docusate sodium (COLACE) 100 MG capsule Take 100 mg by mouth daily as needed for mild constipation.   Yes [provider]  glucosamine-chondroitin 500-400 MG tablet Take 1 tablet by mouth daily.    Yes [provider]  loratadine (CLARITIN) 10 MG tablet Take 10 mg by mouth daily as needed for allergies.    Yes [provider]  Multiple Vitamins-Minerals (CENTRUM SILVER ADULT 50+ PO) Take 1 tablet by mouth daily.    Yes [provider]    omeprazole (PRILOSEC) 20 MG capsule Take 20 mg by mouth daily.  06/13/13  Yes [provider]  Polyethyl Glycol-Propyl Glycol (SYSTANE ULTRA) 0.4-0.3 % SOLN Place 1 drop into both eyes 2 (two) times daily as needed (dry eyes).   Yes [provider]  propranolol (INDERAL) 10 MG tablet Take 10 mg by mouth See admin instructions. Taking 1 (10mg )  tablet two or three times daily 05/16/13  Yes [provider]  traMADol (ULTRAM) 50 MG tablet Take 50 mg by mouth every 8 (eight) hours as needed for pain. 11/27/19  Yes [provider]  Influenza vac split quadrivalent PF (FLUZONE HIGH-DOSE) 0.5 ML injection Fluzone High-Dose 2018-2019 (PF) 180 mcg/0.5 mL intramuscular syringe  TO BE ADMINISTERED BY PHARMACIST FOR IMMUNIZATION    [provider]   DG Chest 2 View  Result Date: 12/05/2019 CLINICAL DATA:  86 year old post fall walking to the bathroom today. EXAM: CHEST - 2 VIEW COMPARISON:  Remote radiograph 01/28/2007 FINDINGS: Lung volumes are low. Upper normal heart size. Mild aortic tortuosity. Aortic atherosclerosis. Subsegmental opacities in the lung bases typical of atelectasis. Possible small pleural effusions. No pneumothorax or confluent airspace disease. No acute osseous abnormalities are seen. Scoliotic curvature of the upper lumbar spine. IMPRESSION: Low lung volumes with bibasilar atelectasis and possible small pleural effusions. Aortic Atherosclerosis (ICD10-I70.0). Electronically Signed   By: Keith Rake M.D.   On: 12/05/2019 19:40   DG Pelvis 1-2 Views  Result Date: 12/05/2019 CLINICAL DATA:  [place] while going to bathroom EXAM: PELVIS - 1-2 VIEW COMPARISON:  None. FINDINGS: SI joints are non widened. Pubic symphysis and rami appear intact. No fracture or malalignment. Mild to moderate degenerative change of the hips. IMPRESSION: No acute osseous abnormality. Electronically Signed   By: Donavan Foil M.D.   On: 12/05/2019 19:39   DG Ankle Complete  Left  Result Date: 12/05/2019 CLINICAL DATA:  Fall.  Left foot and ankle pain.  Ankle swelling. EXAM: LEFT ANKLE COMPLETE - 3+ VIEW COMPARISON:  None. FINDINGS: Three views study shows an oblique fracture of the distal fibula, proximal to the tibiotalar joint. Associated fracture of the medial malleolus evident. Lateral film shows no definite posterior lip fracture of the distal tibia. No evidence for subluxation. Bones are diffusely demineralized. Overlying soft tissue swelling evident. IMPRESSION: Bimalleolar ankle fracture without gross subluxation. Electronically Signed   By: Misty Stanley M.D.   On: 12/05/2019 19:40   CT HEAD WO CONTRAST  Result Date: 12/06/2019 CLINICAL DATA:  Follow-up subarachnoid hemorrhage EXAM: CT HEAD WITHOUT CONTRAST TECHNIQUE: Contiguous axial images were obtained from the base of the skull through the vertex without intravenous contrast. COMPARISON:  Yesterday FINDINGS: Brain: Subdural hematoma along the left frontal parietal convexity with mild increase from before. Maximal thickness is  measured in the parietal region at 7 mm. Interhemispheric subdural continuation is now seen. There is patchy subarachnoid hemorrhage greatest at the left vertex, mildly increased from before. No brain swelling or infarct seen. No hydrocephalus. Generalized brain atrophy Vascular: Atherosclerotic calcification Skull: No acute finding Sinuses/Orbits: Calcified mass at the right nasal cavity and medial right maxillary sinus with chronic right maxillary sinusitis. IMPRESSION: 1. Left convexity subdural hematoma with increase from yesterday, now up to 7 mm in thickness along the parietal lobe. 2. Mild increase in patchy subarachnoid hemorrhage at the left vertex. 3. No midline shift. 4. Chronic calcified mass obstructing the right maxillary sinus. Electronically Signed   By: Monte Fantasia M.D.   On: 12/06/2019 07:44   CT Head Wo Contrast  Result Date: 12/05/2019 CLINICAL DATA:  Fall EXAM: CT  HEAD WITHOUT CONTRAST CT CERVICAL SPINE WITHOUT CONTRAST TECHNIQUE: Multidetector CT imaging of the head and cervical spine was performed following the standard protocol without intravenous contrast. Multiplanar CT image reconstructions of the cervical spine were also generated. COMPARISON:  None. FINDINGS: CT HEAD FINDINGS Brain: There is mixed subdural and subarachnoid hemorrhage over the posterior left hemisphere. There is no associated mass effect. Subdural component measures 6 mm. There is generalized atrophy without lobar predilection. There is hypoattenuation of the periventricular white matter, most commonly indicating chronic ischemic microangiopathy. Vascular: No abnormal hyperdensity of the major intracranial arteries or dural venous sinuses. No intracranial atherosclerosis. Skull: The visualized skull base, calvarium and extracranial soft tissues are normal. Sinuses/Orbits: Chronic right maxillary sinusitis with possible antrochoanal polyp. The orbits are normal. CT CERVICAL SPINE FINDINGS Alignment: No static subluxation. Facets are aligned. Occipital condyles are normally positioned. Skull base and vertebrae: No acute fracture. Soft tissues and spinal canal: No prevertebral fluid or swelling. No visible canal hematoma. Disc levels: Multilevel degenerative disc disease and facet arthrosis. No spinal canal stenosis. There is severe left C7 foraminal stenosis. Upper chest: Right pleural effusion.  4 mm left apical nodule. Other: Calcific aortic atherosclerosis. IMPRESSION: 1. Mixed subdural and subarachnoid hemorrhage over the posterior left hemisphere without associated mass effect. 2. No acute fracture or static subluxation of the cervical spine. 3. Right pleural effusion. Aortic Atherosclerosis (ICD10-I70.0). Critical Value/emergent results were called by telephone at the time of interpretation on 12/05/2019 at 7:42 pm to provider JULIE HAVILAND , who verbally acknowledged these results. Electronically  Signed   By: Ulyses Jarred M.D.   On: 12/05/2019 19:43   CT Cervical Spine Wo Contrast  Result Date: 12/05/2019 CLINICAL DATA:  Fall EXAM: CT HEAD WITHOUT CONTRAST CT CERVICAL SPINE WITHOUT CONTRAST TECHNIQUE: Multidetector CT imaging of the head and cervical spine was performed following the standard protocol without intravenous contrast. Multiplanar CT image reconstructions of the cervical spine were also generated. COMPARISON:  None. FINDINGS: CT HEAD FINDINGS Brain: There is mixed subdural and subarachnoid hemorrhage over the posterior left hemisphere. There is no associated mass effect. Subdural component measures 6 mm. There is generalized atrophy without lobar predilection. There is hypoattenuation of the periventricular white matter, most commonly indicating chronic ischemic microangiopathy. Vascular: No abnormal hyperdensity of the major intracranial arteries or dural venous sinuses. No intracranial atherosclerosis. Skull: The visualized skull base, calvarium and extracranial soft tissues are normal. Sinuses/Orbits: Chronic right maxillary sinusitis with possible antrochoanal polyp. The orbits are normal. CT CERVICAL SPINE FINDINGS Alignment: No static subluxation. Facets are aligned. Occipital condyles are normally positioned. Skull base and vertebrae: No acute fracture. Soft tissues and spinal canal: No prevertebral fluid or swelling.  No visible canal hematoma. Disc levels: Multilevel degenerative disc disease and facet arthrosis. No spinal canal stenosis. There is severe left C7 foraminal stenosis. Upper chest: Right pleural effusion.  4 mm left apical nodule. Other: Calcific aortic atherosclerosis. IMPRESSION: 1. Mixed subdural and subarachnoid hemorrhage over the posterior left hemisphere without associated mass effect. 2. No acute fracture or static subluxation of the cervical spine. 3. Right pleural effusion. Aortic Atherosclerosis (ICD10-I70.0). Critical Value/emergent results were called by  telephone at the time of interpretation on 12/05/2019 at 7:42 pm to provider JULIE HAVILAND , who verbally acknowledged these results. Electronically Signed   By: Ulyses Jarred M.D.   On: 12/05/2019 19:43   ECHOCARDIOGRAM COMPLETE  Result Date: 12/06/2019    ECHOCARDIOGRAM REPORT   Patient Name:   Kenlei ESMAE DONATHAN Date of Exam: 12/06/2019 Medical Rec #:  [id number]       Height:       64.0 in Accession #:    [id number]      Weight:       165.0 lb Date of Birth:  [birth year]       BSA:          1.803 m Patient Age:    58 years        BP:           159/66 mmHg Patient Gender: F               HR:           87 bpm. Exam Location:  Inpatient Procedure: 2D Echo, Cardiac Doppler and Color Doppler Indications:    Elevated troponin  History:        Patient has no prior history of Echocardiogram examinations.                 Risk Factors:Hypertension.  Sonographer:    Clayton Lefort RDCS (AE) Referring Phys: [id number] Suann Larry [facility]  Sonographer Comments: Suboptimal subcostal window and Technically challenging study due to limited acoustic windows. Unable to roll patient on left side due to broken ankle. IMPRESSIONS  1. Left ventricular ejection fraction, by estimation, is 60 to 65%. The left ventricle has normal function. The left ventricle has no regional wall motion abnormalities. There is severe left ventricular hypertrophy. Left ventricular diastolic parameters  are consistent with Grade I diastolic dysfunction (impaired relaxation). Elevated left atrial pressure.  2. Right ventricular systolic function is normal. The right ventricular size is normal. There is mildly elevated pulmonary artery systolic pressure.  3. The mitral valve is normal in structure. Trivial mitral valve regurgitation. No evidence of mitral stenosis.  4. The aortic valve is tricuspid. Aortic valve regurgitation is not visualized. No aortic stenosis is present.  5. The inferior vena cava is normal in size with greater than 50% respiratory  variability, suggesting right atrial pressure of 3 mmHg. FINDINGS  Left Ventricle: Left ventricular ejection fraction, by estimation, is 60 to 65%. The left ventricle has normal function. The left ventricle has no regional wall motion abnormalities. The left ventricular internal cavity size was normal in size. There is  severe left ventricular hypertrophy. Left ventricular diastolic parameters are consistent with Grade I diastolic dysfunction (impaired relaxation). Elevated left atrial pressure. Right Ventricle: The right ventricular size is normal.Right ventricular systolic function is normal. There is mildly elevated pulmonary artery systolic pressure. The tricuspid regurgitant velocity is 2.96 m/s, and with an assumed right atrial pressure of  3 mmHg, the estimated right ventricular systolic pressure is 22.9 mmHg.  Left Atrium: Left atrial size was normal in size. Right Atrium: Right atrial size was normal in size. Pericardium: There is no evidence of pericardial effusion. Mitral Valve: The mitral valve is normal in structure. Mild mitral annular calcification. Trivial mitral valve regurgitation. No evidence of mitral valve stenosis. MV peak gradient, 10.5 mmHg. The mean mitral valve gradient is 3.0 mmHg. Tricuspid Valve: The tricuspid valve is normal in structure. Tricuspid valve regurgitation is trivial. No evidence of tricuspid stenosis. Aortic Valve: The aortic valve is tricuspid. Aortic valve regurgitation is not visualized. No aortic stenosis is present. Aortic valve mean gradient measures 4.0 mmHg. Aortic valve peak gradient measures 8.0 mmHg. Aortic valve area, by VTI measures 2.52 cm. Pulmonic Valve: The pulmonic valve was not well visualized. Pulmonic valve regurgitation is not visualized. No evidence of pulmonic stenosis. Aorta: The aortic root is normal in size and structure. Venous: The inferior vena cava is normal in size with greater than 50% respiratory variability, suggesting right atrial  pressure of 3 mmHg. IAS/Shunts: No atrial level shunt detected by color flow Doppler.  LEFT VENTRICLE PLAX 2D LVIDd:         2.90 cm  Diastology LVIDs:         2.30 cm  LV e' medial:    3.26 cm/s LV PW:         1.60 cm  LV E/e' medial:  22.1 LV IVS:        1.90 cm  LV e' lateral:   4.13 cm/s LVOT diam:     1.90 cm  LV E/e' lateral: 17.4 LV SV:         58 LV SV Index:   32 LVOT Area:     2.84 cm  RIGHT VENTRICLE             IVC RV S prime:     14.00 cm/s  IVC diam: 1.40 cm TAPSE (M-mode): 1.5 cm LEFT ATRIUM             Index       RIGHT ATRIUM           Index LA diam:        2.10 cm 1.16 cm/m  RA Area:     12.30 cm LA Vol (A2C):   17.2 ml 9.54 ml/m  RA Volume:   25.10 ml  13.92 ml/m LA Vol (A4C):   28.1 ml 15.59 ml/m LA Biplane Vol: 22.8 ml 12.65 ml/m  AORTIC VALVE AV Area (Vmax):    2.57 cm AV Area (Vmean):   2.54 cm AV Area (VTI):     2.52 cm AV Vmax:           141.00 cm/s AV Vmean:          96.600 cm/s AV VTI:            0.231 m AV Peak Grad:      8.0 mmHg AV Mean Grad:      4.0 mmHg LVOT Vmax:         128.00 cm/s LVOT Vmean:        86.400 cm/s LVOT VTI:          0.205 m LVOT/AV VTI ratio: 0.89  AORTA Ao Root diam: 3.50 cm Ao Asc diam:  3.60 cm MITRAL VALVE                TRICUSPID VALVE MV Area (PHT): 2.17 cm     TR Peak grad:   35.0 mmHg MV  Peak grad:  10.5 mmHg    TR Vmax:        296.00 cm/s MV Mean grad:  3.0 mmHg MV Vmax:       1.62 m/s     SHUNTS MV Vmean:      84.3 cm/s    Systemic VTI:  0.20 m MV Decel Time: 349 msec     Systemic Diam: 1.90 cm MV E velocity: 72.00 cm/s MV A velocity: 125.00 cm/s MV E/A ratio:  0.58 Kirk Ruths MD Electronically signed by Kirk Ruths MD Signature Date/Time: 12/06/2019/2:45:54 PM    Final     Positive ROS: All other systems have been reviewed and were otherwise negative with the exception of those mentioned in the HPI and as above.  Physical Exam: General: Alert, no acute distress Cardiovascular: No pedal edema Respiratory: No cyanosis, no use of  accessory musculature GI: No organomegaly, abdomen is soft and non-tender Skin: No lesions in the area of chief complaint Neurologic: Sensation intact distally Psychiatric: Patient is competent for consent with normal mood and affect Lymphatic: No axillary or cervical lymphadenopathy  MUSCULOSKELETAL:  Left lower extremity:  Short leg splint in place. This appears in good repair. She has some redness noted on the left foot with some swelling. Otherwise sensation intact light touch and motor intact. Capillary refill less than 2 seconds.  Assessment: Left closed bimalleolar ankle fracture.  Plan: -I reviewed the x-rays and discussed the findings on those films with the patient and her daughter. She does have a bimalleolar fracture however, this is very well aligned with the mortise congruent. Given her age and functional demands I think she would be appropriate for nonoperative treatment. I believe the risk of surgery would outweigh any potential benefit. Either surgery or nonsurgical treatment in an osteoporotic individual would warrant at least 6 weeks of nonweightbearing status. Therefore, we cannot accelerate that by subjecting her to surgery.  -Our recommendation is for nonweightbearing in a short leg splint for 6 weeks total. We will transition her into a cast at 2 weeks. I would like to get a repeat x-ray in 1 week to ensure no displacement. If this displaces that would increase her risk for wound complications and potentially skin necrosis, and at that point we would need to consider surgery.  -We will follow along remotely. If she still here in 1 week I will order some x-rays in the splint.    Nicholes Stairs, MD Cell [phone]    12/06/2019 4:14 PM

## 2019-12-06 NOTE — ED Notes (Signed)
Pt transported to CT ?

## 2019-12-06 NOTE — ED Notes (Signed)
bfast ordered 

## 2019-12-06 NOTE — Progress Notes (Signed)
[facility] PROGRESS NOTE    Faith Bolton  QJF:[id number] DOB: [birth year] DOA: 12/05/2019 PCP: Prince Solian, MD      Brief Narrative:  Faith Bolton is a 86 y.o. F with hyponatremia, HTN who presented after a fall.  Evidently she fell the day prior to admission because her "ankle gave out".  Declined EMS transport that day, and then next day, fell again while walking to the bathroom, struck her head, and this time was transported.  In the ER, found to have a bimalleolar ankle fracture, SDH/SAH, hstrop 122, normal c-spine, and Na 126.        Assessment & Plan:  Subdural hemorrhage Subarachnoid hemorrhage Repeat CT head shows slight interval increase in size, but no change in mentation.  Neurosurgery have reviewed this, and recommend mobilization as tolerated, and follow-up in the office with noncontrast head CT in 2 to 3 weeks. -Consult Neurosurgery -Hold aspirin  Elevated troponin Doubt ACS, troponin already trending down.  Minimal elevation. -Obtain echo  Ankle fracture Plans for nonoperative management, no weightbearing for 4 weeks, PT eval, and SNF placement. -Consult Orthopedics, appreciate cares  Hyponatremia Chronic, asymptomatic  Hypertension Blood pressure elevated -Resume home [place]       Disposition: Status is: Inpatient  Remains inpatient appropriate because:She has a subdural hemorrhage, and ankle fracture requires nonweightbearing, will need full nursing cares   Dispo: The patient is from: Home              Anticipated d/c is to: SNF              Anticipated d/c date is: 3 days              Patient currently is not medically stable to d/c.              MDM: The below labs and imaging reports were reviewed and summarized above.  Medication management as above.   DVT prophylaxis: SCDs Start: 12/05/19 2151  Code Status: DNr Family Communication: Daughter at the  bedside           Subjective: The patient has some mild left ankle pain.  No confusion.  No chest pain, no palpitations, no dyspnea, no orthopnea.  No leg swelling.  Objective: Vitals:   12/06/19 1100 12/06/19 1205 12/06/19 1300 12/06/19 1544  BP:  (!) 159/66  111/69  Pulse: 87 87 85 84  Resp: 18 18 (!) 21 (!) 22  Temp:  98.2 F (36.8 C)  98.1 F (36.7 C)  TempSrc:  Oral  Oral  SpO2: 96% 93% 94% 95%  Weight:      Height:        Intake/Output Summary (Last 24 hours) at 12/06/2019 1716 Last data filed at 12/06/2019 1420 Gross per 24 hour  Intake 120 ml  Output --  Net 120 ml   Filed Weights   12/05/19 1800  Weight: 74.8 kg    Examination: General appearance:  adult female, alert and in moderate distress.   HEENT: Anicteric, conjunctiva pink, lids and lashes normal. No nasal deformity, discharge, epistaxis.  Lips moist.   Skin: Warm and dry.  no jaundice.  No suspicious rashes or lesions. Cardiac: RRR, nl S1-S2, no murmurs appreciated.  Capillary refill is brisk.  JVP not visible.  No LE edema.  Radial pulses 2+ and symmetric. Respiratory: Normal respiratory rate and rhythm.  CTAB without rales or wheezes. Abdomen: Abdomen soft.  No TTP or guarding. No ascites, distension, hepatosplenomegaly.  MSK: No deformities or effusions.  The left ankle is splinted.  The toes are red and swollen. Neuro: Awake and alert.  EOMI, moves upper extremities with generalized weakness, normal coordination. Speech fluent.    Psych: Sensorium intact and responding to questions, attention normal. Affect normal.  Judgment and insight appear impaired.    Data Reviewed: I have personally reviewed following labs and imaging studies:  CBC: Recent Labs  Lab 12/05/19 1815 12/06/19 0435  WBC 9.1 7.9  NEUTROABS 7.1  --   HGB 13.2 11.9*  HCT 38.7 34.1*  MCV 90.2 90.7  PLT 282 604   Basic Metabolic Panel: Recent Labs  Lab 12/05/19 1815 12/06/19 0435  NA 126* 126*  K 4.7 3.9  CL  89* 91*  CO2 24 22  GLUCOSE 169* 150*  BUN 9 6*  CREATININE 0.81 0.74  CALCIUM 10.0 9.2   GFR: Estimated Creatinine Clearance: 38.9 mL/min (by C-G formula based on SCr of 0.74 mg/dL). Liver Function Tests: Recent Labs  Lab 12/05/19 1815  AST 42*  ALT 17  ALKPHOS 95  BILITOT 1.0  PROT 6.9  ALBUMIN 3.6   No results for input(s): LIPASE, AMYLASE in the last 168 hours. No results for input(s): AMMONIA in the last 168 hours. Coagulation Profile: Recent Labs  Lab 12/06/19 0435  INR 1.0   Cardiac Enzymes: No results for input(s): CKTOTAL, CKMB, CKMBINDEX, TROPONINI in the last 168 hours. BNP (last 3 results) No results for input(s): PROBNP in the last 8760 hours. HbA1C: No results for input(s): HGBA1C in the last 72 hours. CBG: No results for input(s): GLUCAP in the last 168 hours. Lipid Profile: No results for input(s): CHOL, HDL, LDLCALC, TRIG, CHOLHDL, LDLDIRECT in the last 72 hours. Thyroid Function Tests: No results for input(s): TSH, T4TOTAL, FREET4, T3FREE, THYROIDAB in the last 72 hours. Anemia Panel: No results for input(s): VITAMINB12, FOLATE, FERRITIN, TIBC, IRON, RETICCTPCT in the last 72 hours. Urine analysis:    Component Value Date/Time   COLORURINE YELLOW 12/05/2019 1925   APPEARANCEUR HAZY (A) 12/05/2019 1925   LABSPEC 1.009 12/05/2019 1925   PHURINE 7.0 12/05/2019 1925   GLUCOSEU NEGATIVE 12/05/2019 1925   HGBUR NEGATIVE 12/05/2019 1925   BILIRUBINUR NEGATIVE 12/05/2019 1925   KETONESUR NEGATIVE 12/05/2019 1925   PROTEINUR 30 (A) 12/05/2019 1925   UROBILINOGEN 0.2 01/28/2007 1741   NITRITE NEGATIVE 12/05/2019 1925   LEUKOCYTESUR NEGATIVE 12/05/2019 1925   Sepsis Labs: @LABRCNTIP (procalcitonin:4,lacticacidven:4)  ) Recent Results (from the past 240 hour(s))  Respiratory Panel by RT PCR (Flu A&B, Covid) - Nasopharyngeal Swab     Status: None   Collection Time: 12/05/19  8:16 PM   Specimen: Nasopharyngeal Swab  Result Value Ref Range Status    SARS Coronavirus 2 by RT PCR NEGATIVE NEGATIVE Final    Comment: (NOTE) SARS-CoV-2 target nucleic acids are NOT DETECTED.  The SARS-CoV-2 RNA is generally detectable in upper respiratoy specimens during the acute phase of infection. The lowest concentration of SARS-CoV-2 viral copies this assay can detect is 131 copies/mL. A negative result does not preclude SARS-Cov-2 infection and should not be used as the sole basis for treatment or other patient management decisions. A negative result may occur with  improper specimen collection/handling, submission of specimen other than nasopharyngeal swab, presence of viral mutation(s) within the areas targeted by this assay, and inadequate number of viral copies (<131 copies/mL). A negative result must be combined with clinical observations, patient history, and epidemiological information. The expected result is Negative.  Fact Sheet for Patients:  PinkCheek.be  Fact Sheet for Healthcare Providers:  GravelBags.it  This test is no t yet approved or cleared by the [place] FDA and  has been authorized for detection and/or diagnosis of SARS-CoV-2 by FDA under an Emergency Use Authorization (EUA). This EUA will remain  in effect (meaning this test can be used) for the duration of the COVID-19 declaration under Section 564(b)(1) of the Act, 21 U.S.C. section 360bbb-3(b)(1), unless the authorization is terminated or revoked sooner.     Influenza A by PCR NEGATIVE NEGATIVE Final   Influenza B by PCR NEGATIVE NEGATIVE Final    Comment: (NOTE) The Xpert Xpress SARS-CoV-2/FLU/RSV assay is intended as an aid in  the diagnosis of influenza from Nasopharyngeal swab specimens and  should not be used as a sole basis for treatment. Nasal washings and  aspirates are unacceptable for Xpert Xpress SARS-CoV-2/FLU/RSV  testing.  Fact Sheet for  Patients: PinkCheek.be  Fact Sheet for Healthcare Providers: GravelBags.it  This test is not yet approved or cleared by the [place] FDA and  has been authorized for detection and/or diagnosis of SARS-CoV-2 by  FDA under an Emergency Use Authorization (EUA). This EUA will remain  in effect (meaning this test can be used) for the duration of the  Covid-19 declaration under Section 564(b)(1) of the Act, 21  U.S.C. section 360bbb-3(b)(1), unless the authorization is  terminated or revoked. Performed at [facility], [place] [address] [place], [place] [address]          Radiology Studies: DG Chest 2 View  Result Date: 12/05/2019 CLINICAL DATA:  86 year old post fall walking to the bathroom today. EXAM: CHEST - 2 VIEW COMPARISON:  Remote radiograph 01/28/2007 FINDINGS: Lung volumes are low. Upper normal heart size. Mild aortic tortuosity. Aortic atherosclerosis. Subsegmental opacities in the lung bases typical of atelectasis. Possible small pleural effusions. No pneumothorax or confluent airspace disease. No acute osseous abnormalities are seen. Scoliotic curvature of the upper lumbar spine. IMPRESSION: Low lung volumes with bibasilar atelectasis and possible small pleural effusions. Aortic Atherosclerosis (ICD10-I70.0). Electronically Signed   By: Keith Rake M.D.   On: 12/05/2019 19:40   DG Pelvis 1-2 Views  Result Date: 12/05/2019 CLINICAL DATA:  [place] while going to bathroom EXAM: PELVIS - 1-2 VIEW COMPARISON:  None. FINDINGS: SI joints are non widened. Pubic symphysis and rami appear intact. No fracture or malalignment. Mild to moderate degenerative change of the hips. IMPRESSION: No acute osseous abnormality. Electronically Signed   By: Donavan Foil M.D.   On: 12/05/2019 19:39   DG Ankle Complete Left  Result Date: 12/05/2019 CLINICAL DATA:  Fall.  Left foot and ankle pain.  Ankle swelling. EXAM: LEFT ANKLE  COMPLETE - 3+ VIEW COMPARISON:  None. FINDINGS: Three views study shows an oblique fracture of the distal fibula, proximal to the tibiotalar joint. Associated fracture of the medial malleolus evident. Lateral film shows no definite posterior lip fracture of the distal tibia. No evidence for subluxation. Bones are diffusely demineralized. Overlying soft tissue swelling evident. IMPRESSION: Bimalleolar ankle fracture without gross subluxation. Electronically Signed   By: Misty Stanley M.D.   On: 12/05/2019 19:40   CT HEAD WO CONTRAST  Result Date: 12/06/2019 CLINICAL DATA:  Follow-up subarachnoid hemorrhage EXAM: CT HEAD WITHOUT CONTRAST TECHNIQUE: Contiguous axial images were obtained from the base of the skull through the vertex without intravenous contrast. COMPARISON:  Yesterday FINDINGS: Brain: Subdural hematoma along the left frontal parietal convexity with mild increase  from before. Maximal thickness is measured in the parietal region at 7 mm. Interhemispheric subdural continuation is now seen. There is patchy subarachnoid hemorrhage greatest at the left vertex, mildly increased from before. No brain swelling or infarct seen. No hydrocephalus. Generalized brain atrophy Vascular: Atherosclerotic calcification Skull: No acute finding Sinuses/Orbits: Calcified mass at the right nasal cavity and medial right maxillary sinus with chronic right maxillary sinusitis. IMPRESSION: 1. Left convexity subdural hematoma with increase from yesterday, now up to 7 mm in thickness along the parietal lobe. 2. Mild increase in patchy subarachnoid hemorrhage at the left vertex. 3. No midline shift. 4. Chronic calcified mass obstructing the right maxillary sinus. Electronically Signed   By: Monte Fantasia M.D.   On: 12/06/2019 07:44   CT Head Wo Contrast  Result Date: 12/05/2019 CLINICAL DATA:  Fall EXAM: CT HEAD WITHOUT CONTRAST CT CERVICAL SPINE WITHOUT CONTRAST TECHNIQUE: Multidetector CT imaging of the head and cervical  spine was performed following the standard protocol without intravenous contrast. Multiplanar CT image reconstructions of the cervical spine were also generated. COMPARISON:  None. FINDINGS: CT HEAD FINDINGS Brain: There is mixed subdural and subarachnoid hemorrhage over the posterior left hemisphere. There is no associated mass effect. Subdural component measures 6 mm. There is generalized atrophy without lobar predilection. There is hypoattenuation of the periventricular white matter, most commonly indicating chronic ischemic microangiopathy. Vascular: No abnormal hyperdensity of the major intracranial arteries or dural venous sinuses. No intracranial atherosclerosis. Skull: The visualized skull base, calvarium and extracranial soft tissues are normal. Sinuses/Orbits: Chronic right maxillary sinusitis with possible antrochoanal polyp. The orbits are normal. CT CERVICAL SPINE FINDINGS Alignment: No static subluxation. Facets are aligned. Occipital condyles are normally positioned. Skull base and vertebrae: No acute fracture. Soft tissues and spinal canal: No prevertebral fluid or swelling. No visible canal hematoma. Disc levels: Multilevel degenerative disc disease and facet arthrosis. No spinal canal stenosis. There is severe left C7 foraminal stenosis. Upper chest: Right pleural effusion.  4 mm left apical nodule. Other: Calcific aortic atherosclerosis. IMPRESSION: 1. Mixed subdural and subarachnoid hemorrhage over the posterior left hemisphere without associated mass effect. 2. No acute fracture or static subluxation of the cervical spine. 3. Right pleural effusion. Aortic Atherosclerosis (ICD10-I70.0). Critical Value/emergent results were called by telephone at the time of interpretation on 12/05/2019 at 7:42 pm to provider JULIE HAVILAND , who verbally acknowledged these results. Electronically Signed   By: Ulyses Jarred M.D.   On: 12/05/2019 19:43   CT Cervical Spine Wo Contrast  Result Date:  12/05/2019 CLINICAL DATA:  Fall EXAM: CT HEAD WITHOUT CONTRAST CT CERVICAL SPINE WITHOUT CONTRAST TECHNIQUE: Multidetector CT imaging of the head and cervical spine was performed following the standard protocol without intravenous contrast. Multiplanar CT image reconstructions of the cervical spine were also generated. COMPARISON:  None. FINDINGS: CT HEAD FINDINGS Brain: There is mixed subdural and subarachnoid hemorrhage over the posterior left hemisphere. There is no associated mass effect. Subdural component measures 6 mm. There is generalized atrophy without lobar predilection. There is hypoattenuation of the periventricular white matter, most commonly indicating chronic ischemic microangiopathy. Vascular: No abnormal hyperdensity of the major intracranial arteries or dural venous sinuses. No intracranial atherosclerosis. Skull: The visualized skull base, calvarium and extracranial soft tissues are normal. Sinuses/Orbits: Chronic right maxillary sinusitis with possible antrochoanal polyp. The orbits are normal. CT CERVICAL SPINE FINDINGS Alignment: No static subluxation. Facets are aligned. Occipital condyles are normally positioned. Skull base and vertebrae: No acute fracture. Soft tissues and spinal canal:  No prevertebral fluid or swelling. No visible canal hematoma. Disc levels: Multilevel degenerative disc disease and facet arthrosis. No spinal canal stenosis. There is severe left C7 foraminal stenosis. Upper chest: Right pleural effusion.  4 mm left apical nodule. Other: Calcific aortic atherosclerosis. IMPRESSION: 1. Mixed subdural and subarachnoid hemorrhage over the posterior left hemisphere without associated mass effect. 2. No acute fracture or static subluxation of the cervical spine. 3. Right pleural effusion. Aortic Atherosclerosis (ICD10-I70.0). Critical Value/emergent results were called by telephone at the time of interpretation on 12/05/2019 at 7:42 pm to provider JULIE HAVILAND , who verbally  acknowledged these results. Electronically Signed   By: Ulyses Jarred M.D.   On: 12/05/2019 19:43   ECHOCARDIOGRAM COMPLETE  Result Date: 12/06/2019    ECHOCARDIOGRAM REPORT   Patient Name:   Chace ATTIE NAWABI Date of Exam: 12/06/2019 Medical Rec #:  [id number]       Height:       64.0 in Accession #:    [id number]      Weight:       165.0 lb Date of Birth:  [birth year]       BSA:          1.803 m Patient Age:    61 years        BP:           159/66 mmHg Patient Gender: F               HR:           87 bpm. Exam Location:  Inpatient Procedure: 2D Echo, Cardiac Doppler and Color Doppler Indications:    Elevated troponin  History:        Patient has no prior history of Echocardiogram examinations.                 Risk Factors:Hypertension.  Sonographer:    Clayton Lefort RDCS (AE) Referring Phys: [id number] Suann Larry [facility]  Sonographer Comments: Suboptimal subcostal window and Technically challenging study due to limited acoustic windows. Unable to roll patient on left side due to broken ankle. IMPRESSIONS  1. Left ventricular ejection fraction, by estimation, is 60 to 65%. The left ventricle has normal function. The left ventricle has no regional wall motion abnormalities. There is severe left ventricular hypertrophy. Left ventricular diastolic parameters  are consistent with Grade I diastolic dysfunction (impaired relaxation). Elevated left atrial pressure.  2. Right ventricular systolic function is normal. The right ventricular size is normal. There is mildly elevated pulmonary artery systolic pressure.  3. The mitral valve is normal in structure. Trivial mitral valve regurgitation. No evidence of mitral stenosis.  4. The aortic valve is tricuspid. Aortic valve regurgitation is not visualized. No aortic stenosis is present.  5. The inferior vena cava is normal in size with greater than 50% respiratory variability, suggesting right atrial pressure of 3 mmHg. FINDINGS  Left Ventricle: Left ventricular ejection  fraction, by estimation, is 60 to 65%. The left ventricle has normal function. The left ventricle has no regional wall motion abnormalities. The left ventricular internal cavity size was normal in size. There is  severe left ventricular hypertrophy. Left ventricular diastolic parameters are consistent with Grade I diastolic dysfunction (impaired relaxation). Elevated left atrial pressure. Right Ventricle: The right ventricular size is normal.Right ventricular systolic function is normal. There is mildly elevated pulmonary artery systolic pressure. The tricuspid regurgitant velocity is 2.96 m/s, and with an assumed right atrial pressure of  3 mmHg, the estimated right ventricular  systolic pressure is 70.2 mmHg. Left Atrium: Left atrial size was normal in size. Right Atrium: Right atrial size was normal in size. Pericardium: There is no evidence of pericardial effusion. Mitral Valve: The mitral valve is normal in structure. Mild mitral annular calcification. Trivial mitral valve regurgitation. No evidence of mitral valve stenosis. MV peak gradient, 10.5 mmHg. The mean mitral valve gradient is 3.0 mmHg. Tricuspid Valve: The tricuspid valve is normal in structure. Tricuspid valve regurgitation is trivial. No evidence of tricuspid stenosis. Aortic Valve: The aortic valve is tricuspid. Aortic valve regurgitation is not visualized. No aortic stenosis is present. Aortic valve mean gradient measures 4.0 mmHg. Aortic valve peak gradient measures 8.0 mmHg. Aortic valve area, by VTI measures 2.52 cm. Pulmonic Valve: The pulmonic valve was not well visualized. Pulmonic valve regurgitation is not visualized. No evidence of pulmonic stenosis. Aorta: The aortic root is normal in size and structure. Venous: The inferior vena cava is normal in size with greater than 50% respiratory variability, suggesting right atrial pressure of 3 mmHg. IAS/Shunts: No atrial level shunt detected by color flow Doppler.  LEFT VENTRICLE PLAX 2D LVIDd:          2.90 cm  Diastology LVIDs:         2.30 cm  LV e' medial:    3.26 cm/s LV PW:         1.60 cm  LV E/e' medial:  22.1 LV IVS:        1.90 cm  LV e' lateral:   4.13 cm/s LVOT diam:     1.90 cm  LV E/e' lateral: 17.4 LV SV:         58 LV SV Index:   32 LVOT Area:     2.84 cm  RIGHT VENTRICLE             IVC RV S prime:     14.00 cm/s  IVC diam: 1.40 cm TAPSE (M-mode): 1.5 cm LEFT ATRIUM             Index       RIGHT ATRIUM           Index LA diam:        2.10 cm 1.16 cm/m  RA Area:     12.30 cm LA Vol (A2C):   17.2 ml 9.54 ml/m  RA Volume:   25.10 ml  13.92 ml/m LA Vol (A4C):   28.1 ml 15.59 ml/m LA Biplane Vol: 22.8 ml 12.65 ml/m  AORTIC VALVE AV Area (Vmax):    2.57 cm AV Area (Vmean):   2.54 cm AV Area (VTI):     2.52 cm AV Vmax:           141.00 cm/s AV Vmean:          96.600 cm/s AV VTI:            0.231 m AV Peak Grad:      8.0 mmHg AV Mean Grad:      4.0 mmHg LVOT Vmax:         128.00 cm/s LVOT Vmean:        86.400 cm/s LVOT VTI:          0.205 m LVOT/AV VTI ratio: 0.89  AORTA Ao Root diam: 3.50 cm Ao Asc diam:  3.60 cm MITRAL VALVE                TRICUSPID VALVE MV Area (PHT): 2.17 cm     TR Peak grad:  35.0 mmHg MV Peak grad:  10.5 mmHg    TR Vmax:        296.00 cm/s MV Mean grad:  3.0 mmHg MV Vmax:       1.62 m/s     SHUNTS MV Vmean:      84.3 cm/s    Systemic VTI:  0.20 m MV Decel Time: 349 msec     Systemic Diam: 1.90 cm MV E velocity: 72.00 cm/s MV A velocity: 125.00 cm/s MV E/A ratio:  0.58 Kirk Ruths MD Electronically signed by Kirk Ruths MD Signature Date/Time: 12/06/2019/2:45:54 PM    Final         Scheduled Meds: . irbesartan  150 mg Oral Daily  . pantoprazole  40 mg Oral Daily  . sodium chloride flush  3 mL Intravenous Q12H   Continuous Infusions:   LOS: 1 day    Time spent: 25 minutes    Edwin Dada, MD [facility] 12/06/2019, 5:16 PM     Please page though [place] or Epic secure chat:  For [employer], Adult nurse

## 2019-12-06 NOTE — Evaluation (Signed)
Physical Therapy Evaluation Patient Details Name: Faith Bolton MRN: [id number] DOB: 06-08-1919 Today's Date: 12/06/2019   History of Present Illness  Pt is a 86 y/o female admitted following 2 falls. Found to have L subdural hematoma and L ankle fx. PMH includes HTN.   Clinical Impression  Pt admitted secondary to problem above with deficits below. Pt seen in ED and requiring max A to roll from side to side this session. Pt moaning out in pain, so further mobility deferred. Feel pt would benefit from SNF level therapies at d/c. Will continue to follow acutely to maximize functional mobility independence and safety.     Follow Up Recommendations SNF;Supervision/Assistance - 24 hour    Equipment Recommendations  Wheelchair cushion (measurements PT);Wheelchair (measurements PT) (hoyer, hoyer lift)    Recommendations for Other Services       Precautions / Restrictions Precautions Precautions: Fall Precaution Comments: 2 falls prior to admission.  Restrictions Weight Bearing Restrictions: Yes LLE Weight Bearing: Non weight bearing      Mobility  Bed Mobility Overal bed mobility: Needs Assistance Bed Mobility: Rolling Rolling: Max assist         General bed mobility comments: Max A to roll from side to side on stretcher. Pt moaning in pain, so further mobility deferred.   Transfers                    Ambulation/Gait                Stairs            Wheelchair Mobility    Modified Rankin (Stroke Patients Only)       Balance                                             Pertinent Vitals/Pain Pain Assessment: Faces Faces Pain Scale: Hurts whole lot Pain Location: back and L ankle  Pain Descriptors / Indicators: Guarding;Grimacing Pain Intervention(s): Limited activity within patient's tolerance;Monitored during session;Repositioned    Home Living Family/patient expects to be discharged to:: Private residence Living  Arrangements: Alone Available Help at Discharge: Family Type of Home: House Home Access: Ramped entrance     Home Layout: One level Home Equipment: Environmental consultant - 4 wheels;Walker - 2 wheels      Prior Function Level of Independence: Independent with assistive device(s)         Comments: However, had been having difficulty ambulating      Hand Dominance        Extremity/Trunk Assessment   Upper Extremity Assessment Upper Extremity Assessment: Defer to OT evaluation    Lower Extremity Assessment Lower Extremity Assessment: LLE deficits/detail LLE Deficits / Details: L ankle splinted. Difficulty performing SLR.     Cervical / Trunk Assessment Cervical / Trunk Assessment: Other exceptions Cervical / Trunk Exceptions: per daughter, does have a hx of compression deformities.    Communication   Communication: HOH  Cognition Arousal/Alertness: Awake/alert Behavior During Therapy: WFL for tasks assessed/performed Overall Cognitive Status: Impaired/Different from baseline Area of Impairment: Problem solving                             Problem Solving: Slow processing        General Comments General comments (skin integrity, edema, etc.): Pt's daughter present during session.  Exercises     Assessment/Plan    PT Assessment Patient needs continued PT services  PT Problem List Decreased strength;Decreased mobility;Decreased balance;Decreased coordination;Decreased knowledge of use of DME;Decreased knowledge of precautions;Decreased activity tolerance;Pain;Decreased cognition       PT Treatment Interventions DME instruction;Functional mobility training;Therapeutic activities;Therapeutic exercise;Balance training;Patient/family education    PT Goals (Current goals can be found in the Care Plan section)  Acute Rehab PT Goals Patient Stated Goal: to decrease pain PT Goal Formulation: With patient Time For Goal Achievement: 12/20/19 Potential to Achieve  Goals: Good    Frequency Min 2X/week   Barriers to discharge Decreased caregiver support      Co-evaluation               AM-PAC PT "6 Clicks" Mobility  Outcome Measure Help needed turning from your back to your side while in a flat bed without using bedrails?: Total Help needed moving from lying on your back to sitting on the side of a flat bed without using bedrails?: Total Help needed moving to and from a bed to a chair (including a wheelchair)?: Total Help needed standing up from a chair using your arms (e.g., wheelchair or bedside chair)?: Total Help needed to walk in hospital room?: Total Help needed climbing 3-5 steps with a railing? : Total 6 Click Score: 6    End of Session   Activity Tolerance: Patient limited by pain Patient left: in bed;with call bell/phone within reach;with family/visitor present (on stretcher in ED ) Nurse Communication: Mobility status PT Visit Diagnosis: Muscle weakness (generalized) (M62.81);Pain;Difficulty in walking, not elsewhere classified (R26.2);History of falling (Z91.81);Repeated falls (R29.6) Pain - Right/Left: Left Pain - part of body: Ankle and joints of foot    Time: 6767-2094 PT Time Calculation (min) (ACUTE ONLY): 12 min   Charges:   PT Evaluation $PT Eval Moderate Complexity: 1 Mod          Reuel Derby, PT, DPT  Acute Rehabilitation Services  Pager: [phone] Office: [phone]   Faith Bolton 12/06/2019, 1:14 PM

## 2019-12-06 NOTE — Progress Notes (Signed)
  Echocardiogram 2D Echocardiogram has been performed.  Faith Bolton 12/06/2019, 2:20 PM

## 2019-12-06 NOTE — Consult Note (Signed)
Reason for Consult:SDH and SAH Referring Physician: Dr. Gilford Raid   HPI: Faith Bolton is a 86 y.o. female with a past medical history significant for hypertension presented to the ED after suffering a fall at home where she struck her head. She reported that she normally ambulates with a walker and suffered a fall yesterday and the day before. She reported that as she was ambulating with her walker, she felt as though her knees gave out and thus causing her to fall. Her initial fall did not result in any head trauma. However, the fall yesterday caused her to strike her head on the bathtub. She denied any LOC but reported severe ankle pain and a decreased ability to ambulate. She was brought to the ED where CT imaging of her head revealed a small SDH and SAH which subsequently led to neurosurgery being consulted.   Past Medical History:  Diagnosis Date  . Cancer ([place])   . Hypertension   . Macular degeneration (senile) of retina, unspecified   . Osteoarthrosis, unspecified whether generalized or localized, unspecified site   . Reflux     Past Surgical History:  Procedure Laterality Date  . APPENDECTOMY    . TONSILLECTOMY      No family history on file.  Social History:  reports that she has never smoked. She has never used smokeless tobacco. She reports that she does not drink alcohol and does not use drugs.  Allergies:  Allergies  Allergen Reactions  . Ciprofloxacin Other (See Comments)    Pt does not remember   . Clindamycin/Lincomycin Diarrhea  . Diphenoxylate-Atropine   . Metronidazole     Medications: I have reviewed the patient's current medications.  Results for orders placed or performed during the hospital encounter of 12/05/19 (from the past 48 hour(s))  Comprehensive metabolic panel     Status: Abnormal   Collection Time: 12/05/19  6:15 PM  Result Value Ref Range   Sodium 126 (L) 135 - 145 mmol/L   Potassium 4.7 3.5 - 5.1 mmol/L   Chloride 89 (L) 98 - 111 mmol/L    CO2 24 22 - 32 mmol/L   Glucose, Bld 169 (H) 70 - 99 mg/dL    Comment: Glucose reference range applies only to samples taken after fasting for at least 8 hours.   BUN 9 8 - 23 mg/dL   Creatinine, Ser 0.81 0.44 - 1.00 mg/dL   Calcium 10.0 8.9 - 10.3 mg/dL   Total Protein 6.9 6.5 - 8.1 g/dL   Albumin 3.6 3.5 - 5.0 g/dL   AST 42 (H) 15 - 41 U/L   ALT 17 0 - 44 U/L   Alkaline Phosphatase 95 38 - 126 U/L   Total Bilirubin 1.0 0.3 - 1.2 mg/dL   GFR calc non Af Amer >60 >60 mL/min   GFR calc Af Amer >60 >60 mL/min   Anion gap 13 5 - 15    Comment: Performed at [facility], [place] [address] [place], [place] [address]  CBC with Differential     Status: None   Collection Time: 12/05/19  6:15 PM  Result Value Ref Range   WBC 9.1 4.0 - 10.5 K/uL   RBC 4.29 3.87 - 5.11 MIL/uL   Hemoglobin 13.2 12.0 - 15.0 g/dL   HCT 38.7 36 - 46 %   MCV 90.2 80.0 - 100.0 fL   MCH 30.8 26.0 - 34.0 pg   MCHC 34.1 30.0 - 36.0 g/dL   RDW 12.3 11.5 -  15.5 %   Platelets 282 150 - 400 K/uL   nRBC 0.0 0.0 - 0.2 %   Neutrophils Relative % 78 %   Neutro Abs 7.1 1.7 - 7.7 K/uL   Lymphocytes Relative 11 %   Lymphs Abs 1.0 0.7 - 4.0 K/uL   Monocytes Relative 9 %   Monocytes Absolute 0.8 0 - 1 K/uL   Eosinophils Relative 1 %   Eosinophils Absolute 0.1 0 - 0 K/uL   Basophils Relative 0 %   Basophils Absolute 0.0 0 - 0 K/uL   Immature Granulocytes 1 %   Abs Immature Granulocytes 0.05 0.00 - 0.07 K/uL    Comment: Performed at [facility] [address] [place], [place] [address]  Troponin I (High Sensitivity)     Status: Abnormal   Collection Time: 12/05/19  6:15 PM  Result Value Ref Range   Troponin I (High Sensitivity) 199 (HH) <18 ng/L    Comment: CRITICAL RESULT CALLED TO, READ BACK BY AND VERIFIED WITH: C.BLACK,RN @1936  12/05/2019 VANG.J (NOTE) Elevated high sensitivity troponin I (hsTnI) values and significant  changes across serial measurements may suggest ACS but many other  chronic and  acute conditions are known to elevate hsTnI results.  Refer to the Links section for chest pain algorithms and additional  guidance. Performed at [facility], [place] [address] [place], [place] [address]   Urinalysis, Routine w reflex microscopic Urine, Unspecified Source     Status: Abnormal   Collection Time: 12/05/19  7:25 PM  Result Value Ref Range   Color, Urine YELLOW YELLOW   APPearance HAZY (A) CLEAR   Specific Gravity, Urine 1.009 1.005 - 1.030   pH 7.0 5.0 - 8.0   Glucose, UA NEGATIVE NEGATIVE mg/dL   Hgb urine dipstick NEGATIVE NEGATIVE   Bilirubin Urine NEGATIVE NEGATIVE   Ketones, ur NEGATIVE NEGATIVE mg/dL   Protein, ur 30 (A) NEGATIVE mg/dL   Nitrite NEGATIVE NEGATIVE   Leukocytes,Ua NEGATIVE NEGATIVE   RBC / HPF 0-5 0 - 5 RBC/hpf   WBC, UA 0-5 0 - 5 WBC/hpf   Bacteria, UA RARE (A) NONE SEEN   Hyaline Casts, UA PRESENT    Amorphous Crystal PRESENT     Comment: Performed at [facility], [address], [place], [place] [address]  Troponin I (High Sensitivity)     Status: Abnormal   Collection Time: 12/05/19  8:04 PM  Result Value Ref Range   Troponin I (High Sensitivity) 248 (HH) <18 ng/L    Comment: CRITICAL VALUE NOTED.  VALUE IS CONSISTENT WITH PREVIOUSLY REPORTED AND CALLED VALUE. (NOTE) Elevated high sensitivity troponin I (hsTnI) values and significant  changes across serial measurements may suggest ACS but many other  chronic and acute conditions are known to elevate hsTnI results.  Refer to the Links section for chest pain algorithms and additional  guidance. Performed at [facility], [place] [address] [place], [place] [address]   Respiratory Panel by RT PCR (Flu A&B, Covid) - Nasopharyngeal Swab     Status: None   Collection Time: 12/05/19  8:16 PM   Specimen: Nasopharyngeal Swab  Result Value Ref Range   SARS Coronavirus 2 by RT PCR NEGATIVE NEGATIVE    Comment: (NOTE) SARS-CoV-2 target nucleic acids are NOT DETECTED.  The  SARS-CoV-2 RNA is generally detectable in upper respiratoy specimens during the acute phase of infection. The lowest concentration of SARS-CoV-2 viral copies this assay can detect is 131 copies/mL. A  negative result does not preclude SARS-Cov-2 infection and should not be used as the sole basis for treatment or other patient management decisions. A negative result may occur with  improper specimen collection/handling, submission of specimen other than nasopharyngeal swab, presence of viral mutation(s) within the areas targeted by this assay, and inadequate number of viral copies (<131 copies/mL). A negative result must be combined with clinical observations, patient history, and epidemiological information. The expected result is Negative.  Fact Sheet for Patients:  PinkCheek.be  Fact Sheet for Healthcare Providers:  GravelBags.it  This test is no t yet approved or cleared by the [place] FDA and  has been authorized for detection and/or diagnosis of SARS-CoV-2 by FDA under an Emergency Use Authorization (EUA). This EUA will remain  in effect (meaning this test can be used) for the duration of the COVID-19 declaration under Section 564(b)(1) of the Act, 21 U.S.C. section 360bbb-3(b)(1), unless the authorization is terminated or revoked sooner.     Influenza A by PCR NEGATIVE NEGATIVE   Influenza B by PCR NEGATIVE NEGATIVE    Comment: (NOTE) The Xpert Xpress SARS-CoV-2/FLU/RSV assay is intended as an aid in  the diagnosis of influenza from Nasopharyngeal swab specimens and  should not be used as a sole basis for treatment. Nasal washings and  aspirates are unacceptable for Xpert Xpress SARS-CoV-2/FLU/RSV  testing.  Fact Sheet for Patients: PinkCheek.be  Fact Sheet for Healthcare Providers: GravelBags.it  This test is not yet approved or cleared by the [place] FDA and  has been authorized for detection and/or diagnosis of SARS-CoV-2 by  FDA under an Emergency Use Authorization (EUA). This EUA will remain  in effect (meaning this test can be used) for the duration of the  Covid-19 declaration under Section 564(b)(1) of the Act, 21  U.S.C. section 360bbb-3(b)(1), unless the authorization is  terminated or revoked. Performed at [facility], [place] [address] [place], [place] [address]   Troponin I (High Sensitivity)     Status: Abnormal   Collection Time: 12/05/19 11:51 PM  Result Value Ref Range   Troponin I (High Sensitivity) 219 (HH) <18 ng/L    Comment: CRITICAL VALUE NOTED.  VALUE IS CONSISTENT WITH PREVIOUSLY REPORTED AND CALLED VALUE. (NOTE) Elevated high sensitivity troponin I (hsTnI) values and significant  changes across serial measurements may suggest ACS but many other  chronic and acute conditions are known to elevate hsTnI results.  Refer to the Links section for chest pain algorithms and additional  guidance. Performed at [facility], [place] [address] [place], [place] [address]   CBC     Status: Abnormal   Collection Time: 12/06/19  4:35 AM  Result Value Ref Range   WBC 7.9 4.0 - 10.5 K/uL   RBC 3.76 (L) 3.87 - 5.11 MIL/uL   Hemoglobin 11.9 (L) 12.0 - 15.0 g/dL   HCT 34.1 (L) 36 - 46 %   MCV 90.7 80.0 - 100.0 fL   MCH 31.6 26.0 - 34.0 pg   MCHC 34.9 30.0 - 36.0 g/dL   RDW 12.2 11.5 - 15.5 %   Platelets 242 150 - 400 K/uL   nRBC 0.0 0.0 - 0.2 %    Comment: Performed at [facility], [place] [address], [place], [place] [address]  Basic metabolic panel     Status: Abnormal   Collection Time: 12/06/19  4:35 AM  Result Value Ref Range   Sodium 126 (L) 135 - 145 mmol/L  Potassium 3.9 3.5 - 5.1 mmol/L    Comment: NO VISIBLE HEMOLYSIS   Chloride 91 (L) 98 - 111 mmol/L   CO2 22 22 - 32 mmol/L   Glucose, Bld 150 (H) 70 - 99 mg/dL    Comment: Glucose reference range applies only to samples taken after  fasting for at least 8 hours.   BUN 6 (L) 8 - 23 mg/dL   Creatinine, Ser 0.74 0.44 - 1.00 mg/dL   Calcium 9.2 8.9 - 10.3 mg/dL   GFR calc non Af Amer >60 >60 mL/min   GFR calc Af Amer >60 >60 mL/min   Anion gap 13 5 - 15    Comment: Performed at [facility] [address] [place], [place] [address]  Protime-INR     Status: None   Collection Time: 12/06/19  4:35 AM  Result Value Ref Range   Prothrombin Time 13.0 11.4 - 15.2 seconds   INR 1.0 0.8 - 1.2    Comment: (NOTE) INR goal varies based on device and disease states. Performed at [facility], [place] [address], [place], [place] [address]   Troponin I (High Sensitivity)     Status: Abnormal   Collection Time: 12/06/19  4:35 AM  Result Value Ref Range   Troponin I (High Sensitivity) 133 (HH) <18 ng/L    Comment: CRITICAL VALUE NOTED.  VALUE IS CONSISTENT WITH PREVIOUSLY REPORTED AND CALLED VALUE. (NOTE) Elevated high sensitivity troponin I (hsTnI) values and significant  changes across serial measurements may suggest ACS but many other  chronic and acute conditions are known to elevate hsTnI results.  Refer to the Links section for chest pain algorithms and additional  guidance. Performed at [facility], [place] [address] [place], [place] [address]     DG Chest 2 View  Result Date: 12/05/2019 CLINICAL DATA:  85 year old post fall walking to the bathroom today. EXAM: CHEST - 2 VIEW COMPARISON:  Remote radiograph 01/28/2007 FINDINGS: Lung volumes are low. Upper normal heart size. Mild aortic tortuosity. Aortic atherosclerosis. Subsegmental opacities in the lung bases typical of atelectasis. Possible small pleural effusions. No pneumothorax or confluent airspace disease. No acute osseous abnormalities are seen. Scoliotic curvature of the upper lumbar spine. IMPRESSION: Low lung volumes with bibasilar atelectasis and possible small pleural effusions. Aortic Atherosclerosis (ICD10-I70.0). Electronically Signed   By:  Keith Rake M.D.   On: 12/05/2019 19:40   DG Pelvis 1-2 Views  Result Date: 12/05/2019 CLINICAL DATA:  [place] while going to bathroom EXAM: PELVIS - 1-2 VIEW COMPARISON:  None. FINDINGS: SI joints are non widened. Pubic symphysis and rami appear intact. No fracture or malalignment. Mild to moderate degenerative change of the hips. IMPRESSION: No acute osseous abnormality. Electronically Signed   By: Donavan Foil M.D.   On: 12/05/2019 19:39   DG Ankle Complete Left  Result Date: 12/05/2019 CLINICAL DATA:  Fall.  Left foot and ankle pain.  Ankle swelling. EXAM: LEFT ANKLE COMPLETE - 3+ VIEW COMPARISON:  None. FINDINGS: Three views study shows an oblique fracture of the distal fibula, proximal to the tibiotalar joint. Associated fracture of the medial malleolus evident. Lateral film shows no definite posterior lip fracture of the distal tibia. No evidence for subluxation. Bones are diffusely demineralized. Overlying soft tissue swelling evident. IMPRESSION: Bimalleolar ankle fracture without gross subluxation. Electronically Signed   By: Misty Stanley M.D.   On: 12/05/2019 19:40   CT HEAD WO CONTRAST  Result Date: 12/06/2019 CLINICAL DATA:  Follow-up  subarachnoid hemorrhage EXAM: CT HEAD WITHOUT CONTRAST TECHNIQUE: Contiguous axial images were obtained from the base of the skull through the vertex without intravenous contrast. COMPARISON:  Yesterday FINDINGS: Brain: Subdural hematoma along the left frontal parietal convexity with mild increase from before. Maximal thickness is measured in the parietal region at 7 mm. Interhemispheric subdural continuation is now seen. There is patchy subarachnoid hemorrhage greatest at the left vertex, mildly increased from before. No brain swelling or infarct seen. No hydrocephalus. Generalized brain atrophy Vascular: Atherosclerotic calcification Skull: No acute finding Sinuses/Orbits: Calcified mass at the right nasal cavity and medial right maxillary sinus with  chronic right maxillary sinusitis. IMPRESSION: 1. Left convexity subdural hematoma with increase from yesterday, now up to 7 mm in thickness along the parietal lobe. 2. Mild increase in patchy subarachnoid hemorrhage at the left vertex. 3. No midline shift. 4. Chronic calcified mass obstructing the right maxillary sinus. Electronically Signed   By: Monte Fantasia M.D.   On: 12/06/2019 07:44   CT Head Wo Contrast  Result Date: 12/05/2019 CLINICAL DATA:  Fall EXAM: CT HEAD WITHOUT CONTRAST CT CERVICAL SPINE WITHOUT CONTRAST TECHNIQUE: Multidetector CT imaging of the head and cervical spine was performed following the standard protocol without intravenous contrast. Multiplanar CT image reconstructions of the cervical spine were also generated. COMPARISON:  None. FINDINGS: CT HEAD FINDINGS Brain: There is mixed subdural and subarachnoid hemorrhage over the posterior left hemisphere. There is no associated mass effect. Subdural component measures 6 mm. There is generalized atrophy without lobar predilection. There is hypoattenuation of the periventricular white matter, most commonly indicating chronic ischemic microangiopathy. Vascular: No abnormal hyperdensity of the major intracranial arteries or dural venous sinuses. No intracranial atherosclerosis. Skull: The visualized skull base, calvarium and extracranial soft tissues are normal. Sinuses/Orbits: Chronic right maxillary sinusitis with possible antrochoanal polyp. The orbits are normal. CT CERVICAL SPINE FINDINGS Alignment: No static subluxation. Facets are aligned. Occipital condyles are normally positioned. Skull base and vertebrae: No acute fracture. Soft tissues and spinal canal: No prevertebral fluid or swelling. No visible canal hematoma. Disc levels: Multilevel degenerative disc disease and facet arthrosis. No spinal canal stenosis. There is severe left C7 foraminal stenosis. Upper chest: Right pleural effusion.  4 mm left apical nodule. Other: Calcific  aortic atherosclerosis. IMPRESSION: 1. Mixed subdural and subarachnoid hemorrhage over the posterior left hemisphere without associated mass effect. 2. No acute fracture or static subluxation of the cervical spine. 3. Right pleural effusion. Aortic Atherosclerosis (ICD10-I70.0). Critical Value/emergent results were called by telephone at the time of interpretation on 12/05/2019 at 7:42 pm to provider JULIE HAVILAND , who verbally acknowledged these results. Electronically Signed   By: Ulyses Jarred M.D.   On: 12/05/2019 19:43   CT Cervical Spine Wo Contrast  Result Date: 12/05/2019 CLINICAL DATA:  Fall EXAM: CT HEAD WITHOUT CONTRAST CT CERVICAL SPINE WITHOUT CONTRAST TECHNIQUE: Multidetector CT imaging of the head and cervical spine was performed following the standard protocol without intravenous contrast. Multiplanar CT image reconstructions of the cervical spine were also generated. COMPARISON:  None. FINDINGS: CT HEAD FINDINGS Brain: There is mixed subdural and subarachnoid hemorrhage over the posterior left hemisphere. There is no associated mass effect. Subdural component measures 6 mm. There is generalized atrophy without lobar predilection. There is hypoattenuation of the periventricular white matter, most commonly indicating chronic ischemic microangiopathy. Vascular: No abnormal hyperdensity of the major intracranial arteries or dural venous sinuses. No intracranial atherosclerosis. Skull: The visualized skull base, calvarium and extracranial soft tissues are normal.  Sinuses/Orbits: Chronic right maxillary sinusitis with possible antrochoanal polyp. The orbits are normal. CT CERVICAL SPINE FINDINGS Alignment: No static subluxation. Facets are aligned. Occipital condyles are normally positioned. Skull base and vertebrae: No acute fracture. Soft tissues and spinal canal: No prevertebral fluid or swelling. No visible canal hematoma. Disc levels: Multilevel degenerative disc disease and facet arthrosis. No  spinal canal stenosis. There is severe left C7 foraminal stenosis. Upper chest: Right pleural effusion.  4 mm left apical nodule. Other: Calcific aortic atherosclerosis. IMPRESSION: 1. Mixed subdural and subarachnoid hemorrhage over the posterior left hemisphere without associated mass effect. 2. No acute fracture or static subluxation of the cervical spine. 3. Right pleural effusion. Aortic Atherosclerosis (ICD10-I70.0). Critical Value/emergent results were called by telephone at the time of interpretation on 12/05/2019 at 7:42 pm to provider JULIE HAVILAND , who verbally acknowledged these results. Electronically Signed   By: Ulyses Jarred M.D.   On: 12/05/2019 19:43    Review of Systems  Neurological: Positive for headaches. Negative for dizziness, tremors, seizures, facial asymmetry, speech difficulty, weakness, light-headedness and numbness.   Blood pressure (!) 161/74, pulse 93, temperature 98.2 F (36.8 C), temperature source Oral, resp. rate (!) 23, height 5\' 4"  (1.626 m), weight 74.8 kg, SpO2 92 %. Physical Exam Constitutional:      General: She is not in acute distress.    Appearance: Normal appearance.  Eyes:     Extraocular Movements: Extraocular movements intact.     Pupils: Pupils are equal, round, and reactive to light.  Neurological:     General: No focal deficit present.     Mental Status: She is alert and oriented to person, place, and time. Mental status is at baseline.     Cranial Nerves: No cranial nerve deficit.     Sensory: No sensory deficit.     Motor: No weakness.     Coordination: Coordination normal.     Deep Tendon Reflexes: Reflexes normal.  Psychiatric:        Mood and Affect: Mood normal.        Behavior: Behavior normal.        Thought Content: Thought content normal.        Judgment: Judgment normal.     Assessment/Plan: Initial CT head was remarkable for a small SDH and SAH over the posterior left hemisphere. No mass effect or midline shift was  appreciated. She was admitted for observation. She remained neurologically intact overnight with no acute issues. Repeat imaging showed a slight progression in the SDH and SAH from yesterday's scan with no mass effect or MLS. She has remained stable from a neurosurgical perspective and no surgical intervention is warranted at this time nor do I believe she would be a good surgical candidate if the need did arise. She should follow up with [place] in the clinic in a couple weeks with repeat CT head. Would recommend patient is able to mobilize and ambulate well prior to being discharged.   Marvis Moeller 12/06/2019, 8:21 AM

## 2019-12-06 NOTE — ED Notes (Signed)
Pt transported back from CT.  

## 2019-12-07 ENCOUNTER — Inpatient Hospital Stay (HOSPITAL_COMMUNITY): Payer: Medicare Other

## 2019-12-07 MED ORDER — VANCOMYCIN HCL 1500 MG/300ML IV SOLN
1500.0000 mg | Freq: Once | INTRAVENOUS | Status: DC
  Filled 2019-12-07: qty 300

## 2019-12-07 MED ORDER — IBUPROFEN 200 MG PO TABS: 400.0000 mg | ORAL_TABLET | Freq: Three times a day (TID) | ORAL | Status: DC | PRN

## 2019-12-07 MED ORDER — SODIUM CHLORIDE 0.9 % IV BOLUS
500.0000 mL | Freq: Once | INTRAVENOUS | Status: AC
  Administered 2019-12-07: 500 mL via INTRAVENOUS

## 2019-12-07 MED ORDER — SODIUM CHLORIDE 0.9 % IV SOLN: 2.0000 g | Freq: Once | INTRAVENOUS | Status: DC

## 2019-12-07 MED ORDER — SODIUM CHLORIDE 0.9 % IV SOLN: 2.0000 g | Freq: Two times a day (BID) | INTRAVENOUS | Status: DC

## 2019-12-07 MED ORDER — VANCOMYCIN HCL IN DEXTROSE 1-5 GM/200ML-% IV SOLN: 1000.0000 mg | INTRAVENOUS | Status: DC

## 2019-12-07 MED ORDER — SODIUM CHLORIDE 0.9 % IV SOLN
1.0000 g | INTRAVENOUS | Status: DC
  Administered 2019-12-07 – 2019-12-09 (×3): 1 g via INTRAVENOUS
  Filled 2019-12-07 (×3): qty 10

## 2019-12-07 NOTE — Progress Notes (Signed)
Dr. Loleta Books notified of patient change in mental status. STAT CT ordered.

## 2019-12-07 NOTE — Progress Notes (Signed)
Pharmacy Antibiotic Note  Faith Bolton is a 86 y.o. female admitted on 12/05/2019 s/p fall with subdural hemorrhage and ankle fracture, now with AMS and concern for infection.  Pharmacy has been consulted for vancomycin and cefepime dosing.  Plan: Vancomycin 1500 mg IV x 1, then 1000 mg IV every 24 hours (target vancomycin trough 15-20) Cefepime 2 g IV every 12 hours Monitor renal function, Cx and clinical progression to narrow Vancomycin trough at steady state  Height: 5\' 4"  (162.6 cm) Weight: 71.9 kg (158 lb 8.2 oz) IBW/kg (Calculated) : 54.7  Temp (24hrs), Avg:98.3 F (36.8 C), Min:97.4 F (36.3 C), Max:100.9 F (38.3 C)  Recent Labs  Lab 12/05/19 1815 12/06/19 0435  WBC 9.1 7.9  CREATININE 0.81 0.74    Estimated Creatinine Clearance: 38.2 mL/min (by C-G formula based on SCr of 0.74 mg/dL).    Allergies  Allergen Reactions  . Ciprofloxacin Other (See Comments)    Pt does not remember   . Clindamycin/Lincomycin Diarrhea  . Diphenoxylate-Atropine   . Metronidazole     Bertis Ruddy, PharmD Clinical Pharmacist Please check AMION for all [place] numbers 12/07/2019 4:05 PM

## 2019-12-07 NOTE — TOC Initial Note (Signed)
Transition of Care [facility]) - Initial/Assessment Note    Patient Details  Name: Faith Bolton MRN: [id number] Date of Birth: 04/08/1919  [facility]) CM/SW Contact:    Alexander Mt, LCSW Phone Number: 12/07/2019, 11:16 AM  Clinical Narrative:                 CSW spoke with pt and pt daughter June via telephone. Introduced self, role, reason for call. Pt from home alone, she has recently begun using walker and also has a rollator. Confirmed home address and PCP. CSW explained recommendations for SNF placement- pt has visited friends at rehab facilities in the past but it has been awhile and she has not been to one herself. Pt and pt daughter okay with referrals being made to SNFs and being provided offers when available. Pt has not been vaccinated for COVID 19, they both voice understanding that there will be a period of quarantine if/when she goes to SNF. TOC team will follow.   Expected Discharge Plan: Skilled Nursing Facility Barriers to Discharge: Continued Medical Work up, SNF Pending bed offer   Patient Goals and CMS Choice Patient states their goals for this hospitalization and ongoing recovery are:: for her to be safe when returning home CMS Medicare.gov Compare Post Acute Care list provided to:: Patient Choice offered to / list presented to : Patient, Adult Children  Expected Discharge Plan and Services Expected Discharge Plan: [place] In-house Referral: Clinical Social Work Discharge Planning Services: CM Consult Post Acute Care Choice: [place] Living arrangements for the past 2 months: [place]  Prior Living Arrangements/Services Living arrangements for the past 2 months: Single Family Home Lives with:: Self Patient language and need for interpreter reviewed:: Yes (no needs) Do you feel safe going back to the place where you live?: Yes      Need for Family Participation in Patient Care: Yes (Comment) (assistance w/  daily cares) Care giver support system in place?: Yes (comment) (adult children) Current home services: DME Criminal Activity/Legal Involvement Pertinent to Current Situation/Hospitalization: No - Comment as needed  Permission Sought/Granted Permission sought to share information with : Family Supports Permission granted to share information with : Yes, Verbal Permission Granted  Share Information with NAME: June Richardson Landry  Permission granted to share info w AGENCY: SNFs  Permission granted to share info w Relationship: daughter  Permission granted to share info w Contact Information: [phone]  Emotional Assessment Appearance:: Other (Comment Required (telephonic assessment) Attitude/Demeanor/Rapport: Other (comment) (telephonic assessment) Affect (typically observed): Other (comment) (telephonic assessment) Orientation: : Oriented to Situation, Oriented to  Time, Oriented to Place, Oriented to Self Alcohol / Substance Use: Not Applicable Psych Involvement: No (comment)  Admission diagnosis:  Hyponatremia [E87.1] Subdural hemorrhage following injury, no loss of consciousness ([place]) [S06.5X0A] SAH (subarachnoid hemorrhage) (HCC) [I60.9] SDH (subdural hematoma) (HCC) [S06.5X9A] Elevated troponin [R77.8] Fall, initial encounter [W19.XXXA] Closed bimalleolar fracture of left ankle, initial encounter [H96.222L] Patient Active Problem List   Diagnosis Date Noted  . Subdural hemorrhage following injury, no loss of consciousness ([place]) 12/05/2019  . Subarachnoid hemorrhage following injury, no loss of consciousness ([place]) 12/05/2019  . Bimalleolar fracture of left ankle, closed, initial encounter 12/05/2019  . Fall at home, initial encounter 12/05/2019  . Hyponatremia 12/05/2019  . Elevated troponin 12/05/2019  . Hypertension   . Degeneration of lumbar intervertebral disc 12/18/2017   PCP:  Prince Solian, MD Pharmacy:   [id number] - [place], [address]  AT [place] [address] Phone: [phone] Fax: [phone]  Readmission Risk Interventions Readmission Risk Prevention Plan 12/07/2019  Post Dischage Appt Not Complete  Appt Comments rec for SNF placement  Medication Screening Complete  Transportation Screening Complete  Some recent data might be hidden

## 2019-12-07 NOTE — NC FL2 (Signed)
[place] LEVEL OF CARE SCREENING TOOL     IDENTIFICATION  Patient Name: Faith Bolton Birthdate: August 11, 1919 Sex: female Admission Date (Current Location): 12/05/2019  [facility] and [place] Number:  Herbalist and Address:  [facility], [place] [address] [place], [place] [address]      Provider Number: [id number]  Attending Physician Name and Address:  Edwin Dada, *  Relative Name and Phone Number:       Current Level of Care: Hospital Recommended Level of Care: [place] Prior Approval Number:    Date Approved/Denied:   PASRR Number: [id number]  Discharge Plan: SNF    Current Diagnoses: Patient Active Problem List   Diagnosis Date Noted  . Subdural hemorrhage following injury, no loss of consciousness ([place]) 12/05/2019  . Subarachnoid hemorrhage following injury, no loss of consciousness ([place]) 12/05/2019  . Bimalleolar fracture of left ankle, closed, initial encounter 12/05/2019  . Fall at home, initial encounter 12/05/2019  . Hyponatremia 12/05/2019  . Elevated troponin 12/05/2019  . Hypertension   . Degeneration of lumbar intervertebral disc 12/18/2017    Orientation RESPIRATION BLADDER Height & Weight     Self, Time, Situation, Place  Normal Continent Weight: 158 lb 8.2 oz (71.9 kg) Height:  5\' 4"  (162.6 cm)  BEHAVIORAL SYMPTOMS/MOOD NEUROLOGICAL BOWEL NUTRITION STATUS      Continent Diet (see discharge summary)  AMBULATORY STATUS COMMUNICATION OF NEEDS Skin   Extensive Assist Verbally Bruising (short leg splint for 6 weeks total; transition to a cast at 2 weeks)                       Personal Care Assistance Level of Assistance  Bathing, Feeding, Dressing Bathing Assistance: Maximum assistance   Dressing Assistance: Maximum assistance     Functional Limitations Info  Sight, Speech, Hearing Sight Info: Adequate Hearing Info: Adequate Speech Info: Adequate    SPECIAL CARE  FACTORS FREQUENCY  PT (By licensed PT), OT (By licensed OT)     PT Frequency: 5x week OT Frequency: 5x week            Contractures Contractures Info: Not present    Additional Factors Info  Code Status, Allergies Code Status Info: DNR Allergies Info: Ciprofloxacin, Clindamycin/lincomycin, Diphenoxylate-atropine, Metronidazole           Current Medications (12/07/2019):  This is the current hospital active medication list Current Facility-Administered Medications  Medication Dose Route Frequency Provider Last Rate Last Admin  . acetaminophen (TYLENOL) tablet 650 mg  650 mg Oral Q6H PRN Lenore Cordia, MD   650 mg at 12/06/19 1652   Or  . acetaminophen (TYLENOL) suppository 650 mg  650 mg Rectal Q6H PRN Zada Finders R, MD      . docusate sodium (COLACE) capsule 100 mg  100 mg Oral Daily PRN Danford, Suann Larry, MD      . ibuprofen (ADVIL) tablet 400 mg  400 mg Oral Q8H PRN Danford, Suann Larry, MD      . irbesartan (AVAPRO) tablet 150 mg  150 mg Oral Daily Edwin Dada, MD   150 mg at 12/07/19 0924  . ondansetron (ZOFRAN) tablet 4 mg  4 mg Oral Q6H PRN Lenore Cordia, MD       Or  . ondansetron (ZOFRAN) injection 4 mg  4 mg Intravenous Q6H PRN Zada Finders R, MD      . pantoprazole (PROTONIX) EC tablet 40 mg  40 mg Oral Daily Edwin Dada, MD   40 mg at 12/07/19 0924  . senna-docusate (Senokot-S) tablet 1 tablet  1 tablet Oral QHS PRN Zada Finders R, MD      . sodium chloride flush (NS) 0.9 % injection 3 mL  3 mL Intravenous Q12H Lenore Cordia, MD   3 mL at 12/07/19 3005     Discharge Medications: Please see discharge summary for a list of discharge medications.  Relevant Imaging Results:  Relevant Lab Results:   Additional Information [phone]; pt is unvaccinated  Alexander Mt, LCSW

## 2019-12-07 NOTE — Progress Notes (Signed)
[facility] PROGRESS NOTE    Faith Bolton  HWE:[id number] DOB: 1920-02-13 DOA: 12/05/2019 PCP: Prince Solian, MD      Brief Narrative:  Faith Bolton is a 86 y.o. F with hyponatremia, HTN who presented after a fall.  Evidently she fell the day prior to admission because her "ankle gave out".  Declined EMS transport that day, and then next day, fell again while walking to the bathroom, struck her head, and this time was transported.  In the ER, found to have a bimalleolar ankle fracture, SDH/SAH, hstrop 122, normal c-spine, and Na 126.        Assessment & Plan:  Subdural hemorrhage Subarachnoid hemorrhage Repeat CT head shows slight interval increase in size, but no change in mentation.  Neurosurgery have reviewed this, and recommend mobilization as tolerated, and follow-up in the office with noncontrast head CT in 2 to 3 weeks. -Consult Neurosurgery -Hold aspirin  Fever Acute metabolic encpehalopathy This morning, patient appeared more confused after dose of oxycodone.  This afternoon she was progressively less alert, then somnolent, then patient developed axillary fever and tachycardia.  She does not have sepsis but my differential includes infection (pneumonia vs UTI) or PE.  She is not a candidate for anticoagulation given her active brain bleed.  -Check blood cultures and urine culture -Start ceftriaxone for UTI  Elevated troponin Doubt ACS, troponin low and flat.  Echo without RWMA.   Ankle fracture Plans for nonoperative management, no weightbearing for 4 weeks, PT eval, and SNF placement if she does not deteriorate.  Hyponatremia Chronic, asymptomatic, no change  Hypertension -Hold Avapro given tachycadia       Disposition: Status is: Inpatient  Remains inpatient appropriate because:She has a subdural hemorrhage, and ankle fracture requires nonweightbearing, will need full nursing cares   Dispo: The patient is from: Home               Anticipated d/c is to: SNF              Anticipated d/c date is: 3 days              Patient currently is not medically stable to d/c.    The patient was admitted with ankle fracture, intracranial hemorrhage.  She now has new mental status changes, tachycardia.          MDM: The below labs and imaging reports were reviewed and summarized above.  Medication management as above.   DVT prophylaxis: SCDs Start: 12/05/19 2151  Code Status: DNr Family Communication: Daughter at the bedside           Subjective: Patient has had progressively worse mentation today.  She has pain in the left ankle.  No abdominal pain, no chest symptoms.  Objective: Vitals:   12/06/19 2324 12/07/19 0328 12/07/19 0500 12/07/19 0829  BP: 136/66 (!) 158/85  (!) 158/92  Pulse: 95 100  (!) 106  Resp: 19 18  19   Temp: 98 F (36.7 C) 97.7 F (36.5 C)  (!) 97.4 F (36.3 C)  TempSrc: Oral Oral  Oral  SpO2: 91% 90%  94%  Weight:   71.9 kg   Height:        Intake/Output Summary (Last 24 hours) at 12/07/2019 1104 Last data filed at 12/07/2019 0923 Gross per 24 hour  Intake 363 ml  Output --  Net 363 ml   Filed Weights   12/05/19 1800 12/07/19 0500  Weight: 74.8 kg 71.9 kg  Examination: General appearance: Elderly female, lying in bed, appears tired. Somewhat confused. HEENT:    Skin:  Cardiac: Tachycardic, regular, no murmurs, JVP not visible, no lower extremity edema Respiratory: Normal respiratory rate and rhythm.  CTAB without rales or wheezes. Abdomen: Abdomen soft.  No TTP or guarding. No ascites, distension, hepatosplenomegaly.   MSK: No deformities or effusions.  The left ankle is splinted.  The toes are red and swollen. Neuro: Awake but sleepy.  moves upper extremities with generalized weakness, normal coordination. Speech nonsensical.    Psych: attention diminished  Judgment and insight appear impaired.    Data Reviewed: I have personally reviewed following labs  and imaging studies:  CBC: Recent Labs  Lab 12/05/19 1815 12/06/19 0435  WBC 9.1 7.9  NEUTROABS 7.1  --   HGB 13.2 11.9*  HCT 38.7 34.1*  MCV 90.2 90.7  PLT 282 287   Basic Metabolic Panel: Recent Labs  Lab 12/05/19 1815 12/06/19 0435  NA 126* 126*  K 4.7 3.9  CL 89* 91*  CO2 24 22  GLUCOSE 169* 150*  BUN 9 6*  CREATININE 0.81 0.74  CALCIUM 10.0 9.2   GFR: Estimated Creatinine Clearance: 38.2 mL/min (by C-G formula based on SCr of 0.74 mg/dL). Liver Function Tests: Recent Labs  Lab 12/05/19 1815  AST 42*  ALT 17  ALKPHOS 95  BILITOT 1.0  PROT 6.9  ALBUMIN 3.6   No results for input(s): LIPASE, AMYLASE in the last 168 hours. No results for input(s): AMMONIA in the last 168 hours. Coagulation Profile: Recent Labs  Lab 12/06/19 0435  INR 1.0   Cardiac Enzymes: No results for input(s): CKTOTAL, CKMB, CKMBINDEX, TROPONINI in the last 168 hours. BNP (last 3 results) No results for input(s): PROBNP in the last 8760 hours. HbA1C: No results for input(s): HGBA1C in the last 72 hours. CBG: No results for input(s): GLUCAP in the last 168 hours. Lipid Profile: No results for input(s): CHOL, HDL, LDLCALC, TRIG, CHOLHDL, LDLDIRECT in the last 72 hours. Thyroid Function Tests: No results for input(s): TSH, T4TOTAL, FREET4, T3FREE, THYROIDAB in the last 72 hours. Anemia Panel: No results for input(s): VITAMINB12, FOLATE, FERRITIN, TIBC, IRON, RETICCTPCT in the last 72 hours. Urine analysis:    Component Value Date/Time   COLORURINE YELLOW 12/05/2019 1925   APPEARANCEUR HAZY (A) 12/05/2019 1925   LABSPEC 1.009 12/05/2019 1925   PHURINE 7.0 12/05/2019 1925   GLUCOSEU NEGATIVE 12/05/2019 1925   HGBUR NEGATIVE 12/05/2019 1925   BILIRUBINUR NEGATIVE 12/05/2019 1925   KETONESUR NEGATIVE 12/05/2019 1925   PROTEINUR 30 (A) 12/05/2019 1925   UROBILINOGEN 0.2 01/28/2007 1741   NITRITE NEGATIVE 12/05/2019 1925   LEUKOCYTESUR NEGATIVE 12/05/2019 1925   Sepsis  Labs: @LABRCNTIP (procalcitonin:4,lacticacidven:4)  ) Recent Results (from the past 240 hour(s))  Respiratory Panel by RT PCR (Flu A&B, Covid) - Nasopharyngeal Swab     Status: None   Collection Time: 12/05/19  8:16 PM   Specimen: Nasopharyngeal Swab  Result Value Ref Range Status   SARS Coronavirus 2 by RT PCR NEGATIVE NEGATIVE Final    Comment: (NOTE) SARS-CoV-2 target nucleic acids are NOT DETECTED.  The SARS-CoV-2 RNA is generally detectable in upper respiratoy specimens during the acute phase of infection. The lowest concentration of SARS-CoV-2 viral copies this assay can detect is 131 copies/mL. A negative result does not preclude SARS-Cov-2 infection and should not be used as the sole basis for treatment or other patient management decisions. A negative result may occur with  improper specimen  collection/handling, submission of specimen other than nasopharyngeal swab, presence of viral mutation(s) within the areas targeted by this assay, and inadequate number of viral copies (<131 copies/mL). A negative result must be combined with clinical observations, patient history, and epidemiological information. The expected result is Negative.  Fact Sheet for Patients:  PinkCheek.be  Fact Sheet for Healthcare Providers:  GravelBags.it  This test is no t yet approved or cleared by the [place] FDA and  has been authorized for detection and/or diagnosis of SARS-CoV-2 by FDA under an Emergency Use Authorization (EUA). This EUA will remain  in effect (meaning this test can be used) for the duration of the COVID-19 declaration under Section 564(b)(1) of the Act, 21 U.S.C. section 360bbb-3(b)(1), unless the authorization is terminated or revoked sooner.     Influenza A by PCR NEGATIVE NEGATIVE Final   Influenza B by PCR NEGATIVE NEGATIVE Final    Comment: (NOTE) The Xpert Xpress SARS-CoV-2/FLU/RSV assay is intended as  an aid in  the diagnosis of influenza from Nasopharyngeal swab specimens and  should not be used as a sole basis for treatment. Nasal washings and  aspirates are unacceptable for Xpert Xpress SARS-CoV-2/FLU/RSV  testing.  Fact Sheet for Patients: PinkCheek.be  Fact Sheet for Healthcare Providers: GravelBags.it  This test is not yet approved or cleared by the [place] FDA and  has been authorized for detection and/or diagnosis of SARS-CoV-2 by  FDA under an Emergency Use Authorization (EUA). This EUA will remain  in effect (meaning this test can be used) for the duration of the  Covid-19 declaration under Section 564(b)(1) of the Act, 21  U.S.C. section 360bbb-3(b)(1), unless the authorization is  terminated or revoked. Performed at [facility], [place] [address] [place], [place] [address]          Radiology Studies: DG Chest 2 View  Result Date: 12/05/2019 CLINICAL DATA:  86 year old post fall walking to the bathroom today. EXAM: CHEST - 2 VIEW COMPARISON:  Remote radiograph 01/28/2007 FINDINGS: Lung volumes are low. Upper normal heart size. Mild aortic tortuosity. Aortic atherosclerosis. Subsegmental opacities in the lung bases typical of atelectasis. Possible small pleural effusions. No pneumothorax or confluent airspace disease. No acute osseous abnormalities are seen. Scoliotic curvature of the upper lumbar spine. IMPRESSION: Low lung volumes with bibasilar atelectasis and possible small pleural effusions. Aortic Atherosclerosis (ICD10-I70.0). Electronically Signed   By: Keith Rake M.D.   On: 12/05/2019 19:40   DG Pelvis 1-2 Views  Result Date: 12/05/2019 CLINICAL DATA:  [place] while going to bathroom EXAM: PELVIS - 1-2 VIEW COMPARISON:  None. FINDINGS: SI joints are non widened. Pubic symphysis and rami appear intact. No fracture or malalignment. Mild to moderate degenerative change of the hips. IMPRESSION:  No acute osseous abnormality. Electronically Signed   By: Donavan Foil M.D.   On: 12/05/2019 19:39   DG Ankle Complete Left  Result Date: 12/05/2019 CLINICAL DATA:  Fall.  Left foot and ankle pain.  Ankle swelling. EXAM: LEFT ANKLE COMPLETE - 3+ VIEW COMPARISON:  None. FINDINGS: Three views study shows an oblique fracture of the distal fibula, proximal to the tibiotalar joint. Associated fracture of the medial malleolus evident. Lateral film shows no definite posterior lip fracture of the distal tibia. No evidence for subluxation. Bones are diffusely demineralized. Overlying soft tissue swelling evident. IMPRESSION: Bimalleolar ankle fracture without gross subluxation. Electronically Signed   By: Misty Stanley M.D.   On: 12/05/2019 19:40   CT HEAD WO CONTRAST  Result  Date: 12/06/2019 CLINICAL DATA:  Follow-up subarachnoid hemorrhage EXAM: CT HEAD WITHOUT CONTRAST TECHNIQUE: Contiguous axial images were obtained from the base of the skull through the vertex without intravenous contrast. COMPARISON:  Yesterday FINDINGS: Brain: Subdural hematoma along the left frontal parietal convexity with mild increase from before. Maximal thickness is measured in the parietal region at 7 mm. Interhemispheric subdural continuation is now seen. There is patchy subarachnoid hemorrhage greatest at the left vertex, mildly increased from before. No brain swelling or infarct seen. No hydrocephalus. Generalized brain atrophy Vascular: Atherosclerotic calcification Skull: No acute finding Sinuses/Orbits: Calcified mass at the right nasal cavity and medial right maxillary sinus with chronic right maxillary sinusitis. IMPRESSION: 1. Left convexity subdural hematoma with increase from yesterday, now up to 7 mm in thickness along the parietal lobe. 2. Mild increase in patchy subarachnoid hemorrhage at the left vertex. 3. No midline shift. 4. Chronic calcified mass obstructing the right maxillary sinus. Electronically Signed   By:  Monte Fantasia M.D.   On: 12/06/2019 07:44   CT Head Wo Contrast  Result Date: 12/05/2019 CLINICAL DATA:  Fall EXAM: CT HEAD WITHOUT CONTRAST CT CERVICAL SPINE WITHOUT CONTRAST TECHNIQUE: Multidetector CT imaging of the head and cervical spine was performed following the standard protocol without intravenous contrast. Multiplanar CT image reconstructions of the cervical spine were also generated. COMPARISON:  None. FINDINGS: CT HEAD FINDINGS Brain: There is mixed subdural and subarachnoid hemorrhage over the posterior left hemisphere. There is no associated mass effect. Subdural component measures 6 mm. There is generalized atrophy without lobar predilection. There is hypoattenuation of the periventricular white matter, most commonly indicating chronic ischemic microangiopathy. Vascular: No abnormal hyperdensity of the major intracranial arteries or dural venous sinuses. No intracranial atherosclerosis. Skull: The visualized skull base, calvarium and extracranial soft tissues are normal. Sinuses/Orbits: Chronic right maxillary sinusitis with possible antrochoanal polyp. The orbits are normal. CT CERVICAL SPINE FINDINGS Alignment: No static subluxation. Facets are aligned. Occipital condyles are normally positioned. Skull base and vertebrae: No acute fracture. Soft tissues and spinal canal: No prevertebral fluid or swelling. No visible canal hematoma. Disc levels: Multilevel degenerative disc disease and facet arthrosis. No spinal canal stenosis. There is severe left C7 foraminal stenosis. Upper chest: Right pleural effusion.  4 mm left apical nodule. Other: Calcific aortic atherosclerosis. IMPRESSION: 1. Mixed subdural and subarachnoid hemorrhage over the posterior left hemisphere without associated mass effect. 2. No acute fracture or static subluxation of the cervical spine. 3. Right pleural effusion. Aortic Atherosclerosis (ICD10-I70.0). Critical Value/emergent results were called by telephone at the time of  interpretation on 12/05/2019 at 7:42 pm to provider JULIE HAVILAND , who verbally acknowledged these results. Electronically Signed   By: Ulyses Jarred M.D.   On: 12/05/2019 19:43   CT Cervical Spine Wo Contrast  Result Date: 12/05/2019 CLINICAL DATA:  Fall EXAM: CT HEAD WITHOUT CONTRAST CT CERVICAL SPINE WITHOUT CONTRAST TECHNIQUE: Multidetector CT imaging of the head and cervical spine was performed following the standard protocol without intravenous contrast. Multiplanar CT image reconstructions of the cervical spine were also generated. COMPARISON:  None. FINDINGS: CT HEAD FINDINGS Brain: There is mixed subdural and subarachnoid hemorrhage over the posterior left hemisphere. There is no associated mass effect. Subdural component measures 6 mm. There is generalized atrophy without lobar predilection. There is hypoattenuation of the periventricular white matter, most commonly indicating chronic ischemic microangiopathy. Vascular: No abnormal hyperdensity of the major intracranial arteries or dural venous sinuses. No intracranial atherosclerosis. Skull: The visualized skull base, calvarium  and extracranial soft tissues are normal. Sinuses/Orbits: Chronic right maxillary sinusitis with possible antrochoanal polyp. The orbits are normal. CT CERVICAL SPINE FINDINGS Alignment: No static subluxation. Facets are aligned. Occipital condyles are normally positioned. Skull base and vertebrae: No acute fracture. Soft tissues and spinal canal: No prevertebral fluid or swelling. No visible canal hematoma. Disc levels: Multilevel degenerative disc disease and facet arthrosis. No spinal canal stenosis. There is severe left C7 foraminal stenosis. Upper chest: Right pleural effusion.  4 mm left apical nodule. Other: Calcific aortic atherosclerosis. IMPRESSION: 1. Mixed subdural and subarachnoid hemorrhage over the posterior left hemisphere without associated mass effect. 2. No acute fracture or static subluxation of the  cervical spine. 3. Right pleural effusion. Aortic Atherosclerosis (ICD10-I70.0). Critical Value/emergent results were called by telephone at the time of interpretation on 12/05/2019 at 7:42 pm to provider JULIE HAVILAND , who verbally acknowledged these results. Electronically Signed   By: Ulyses Jarred M.D.   On: 12/05/2019 19:43   ECHOCARDIOGRAM COMPLETE  Result Date: 12/06/2019    ECHOCARDIOGRAM REPORT   Patient Name:   Faith Bolton Date of Exam: 12/06/2019 Medical Rec #:  [id number]       Height:       64.0 in Accession #:    [id number]      Weight:       165.0 lb Date of Birth:  1919-06-18       BSA:          1.803 m Patient Age:    18 years        BP:           159/66 mmHg Patient Gender: F               HR:           87 bpm. Exam Location:  Inpatient Procedure: 2D Echo, Cardiac Doppler and Color Doppler Indications:    Elevated troponin  History:        Patient has no prior history of Echocardiogram examinations.                 Risk Factors:Hypertension.  Sonographer:    Clayton Lefort RDCS (AE) Referring Phys: [id number] [facility]  Sonographer Comments: Suboptimal subcostal window and Technically challenging study due to limited acoustic windows. Unable to roll patient on left side due to broken ankle. IMPRESSIONS  1. Left ventricular ejection fraction, by estimation, is 60 to 65%. The left ventricle has normal function. The left ventricle has no regional wall motion abnormalities. There is severe left ventricular hypertrophy. Left ventricular diastolic parameters  are consistent with Grade I diastolic dysfunction (impaired relaxation). Elevated left atrial pressure.  2. Right ventricular systolic function is normal. The right ventricular size is normal. There is mildly elevated pulmonary artery systolic pressure.  3. The mitral valve is normal in structure. Trivial mitral valve regurgitation. No evidence of mitral stenosis.  4. The aortic valve is tricuspid. Aortic valve regurgitation is not  visualized. No aortic stenosis is present.  5. The inferior vena cava is normal in size with greater than 50% respiratory variability, suggesting right atrial pressure of 3 mmHg. FINDINGS  Left Ventricle: Left ventricular ejection fraction, by estimation, is 60 to 65%. The left ventricle has normal function. The left ventricle has no regional wall motion abnormalities. The left ventricular internal cavity size was normal in size. There is  severe left ventricular hypertrophy. Left ventricular diastolic parameters are consistent with Grade I diastolic dysfunction (impaired relaxation).  Elevated left atrial pressure. Right Ventricle: The right ventricular size is normal.Right ventricular systolic function is normal. There is mildly elevated pulmonary artery systolic pressure. The tricuspid regurgitant velocity is 2.96 m/s, and with an assumed right atrial pressure of  3 mmHg, the estimated right ventricular systolic pressure is 00.9 mmHg. Left Atrium: Left atrial size was normal in size. Right Atrium: Right atrial size was normal in size. Pericardium: There is no evidence of pericardial effusion. Mitral Valve: The mitral valve is normal in structure. Mild mitral annular calcification. Trivial mitral valve regurgitation. No evidence of mitral valve stenosis. MV peak gradient, 10.5 mmHg. The mean mitral valve gradient is 3.0 mmHg. Tricuspid Valve: The tricuspid valve is normal in structure. Tricuspid valve regurgitation is trivial. No evidence of tricuspid stenosis. Aortic Valve: The aortic valve is tricuspid. Aortic valve regurgitation is not visualized. No aortic stenosis is present. Aortic valve mean gradient measures 4.0 mmHg. Aortic valve peak gradient measures 8.0 mmHg. Aortic valve area, by VTI measures 2.52 cm. Pulmonic Valve: The pulmonic valve was not well visualized. Pulmonic valve regurgitation is not visualized. No evidence of pulmonic stenosis. Aorta: The aortic root is normal in size and structure.  Venous: The inferior vena cava is normal in size with greater than 50% respiratory variability, suggesting right atrial pressure of 3 mmHg. IAS/Shunts: No atrial level shunt detected by color flow Doppler.  LEFT VENTRICLE PLAX 2D LVIDd:         2.90 cm  Diastology LVIDs:         2.30 cm  LV e' medial:    3.26 cm/s LV PW:         1.60 cm  LV E/e' medial:  22.1 LV IVS:        1.90 cm  LV e' lateral:   4.13 cm/s LVOT diam:     1.90 cm  LV E/e' lateral: 17.4 LV SV:         58 LV SV Index:   32 LVOT Area:     2.84 cm  RIGHT VENTRICLE             IVC RV S prime:     14.00 cm/s  IVC diam: 1.40 cm TAPSE (M-mode): 1.5 cm LEFT ATRIUM             Index       RIGHT ATRIUM           Index LA diam:        2.10 cm 1.16 cm/m  RA Area:     12.30 cm LA Vol (A2C):   17.2 ml 9.54 ml/m  RA Volume:   25.10 ml  13.92 ml/m LA Vol (A4C):   28.1 ml 15.59 ml/m LA Biplane Vol: 22.8 ml 12.65 ml/m  AORTIC VALVE AV Area (Vmax):    2.57 cm AV Area (Vmean):   2.54 cm AV Area (VTI):     2.52 cm AV Vmax:           141.00 cm/s AV Vmean:          96.600 cm/s AV VTI:            0.231 m AV Peak Grad:      8.0 mmHg AV Mean Grad:      4.0 mmHg LVOT Vmax:         128.00 cm/s LVOT Vmean:        86.400 cm/s LVOT VTI:          0.205 m LVOT/AV  VTI ratio: 0.89  AORTA Ao Root diam: 3.50 cm Ao Asc diam:  3.60 cm MITRAL VALVE                TRICUSPID VALVE MV Area (PHT): 2.17 cm     TR Peak grad:   35.0 mmHg MV Peak grad:  10.5 mmHg    TR Vmax:        296.00 cm/s MV Mean grad:  3.0 mmHg MV Vmax:       1.62 m/s     SHUNTS MV Vmean:      84.3 cm/s    Systemic VTI:  0.20 m MV Decel Time: 349 msec     Systemic Diam: 1.90 cm MV E velocity: 72.00 cm/s MV A velocity: 125.00 cm/s MV E/A ratio:  0.58 Kirk Ruths MD Electronically signed by Kirk Ruths MD Signature Date/Time: 12/06/2019/2:45:54 PM    Final         Scheduled Meds: . irbesartan  150 mg Oral Daily  . pantoprazole  40 mg Oral Daily  . sodium chloride flush  3 mL Intravenous Q12H    Continuous Infusions:   LOS: 2 days    Time spent: 25 minutes    Edwin Dada, MD [facility] 12/07/2019, 11:04 AM     Please page though [place] or Epic secure chat:  For [employer], Adult nurse

## 2019-12-08 LAB — LACTIC ACID, PLASMA: Lactic Acid, Venous: 1.4 mmol/L (ref 0.5–1.9)

## 2019-12-08 NOTE — Care Management (Signed)
Spoke w patient's daughters, over the phone and at bedside. Discussed level of support needed at home with help of OT who was finishing consult.  We discussed how going home would require a lot of help and support and how we could make that work with [facility] and private duty care, and DME.  Both daughters agreed that it would be in the patient's and their best interest to consider an option other than going home.  Updated both Kathlee Nations and [place] who will follow up with family on Monday.

## 2019-12-08 NOTE — Progress Notes (Signed)
CSW preparing to provide bed offers for pt but MD stated after having conversation with pt's daughter that they wouldn't want her to go through mandatory quarantine at SNF due to not being vaccinated and expressed interest in [facility] options as prognosis on pt's status is up in the air. CSW advised RNCM of pt's family's request, offers not presented.

## 2019-12-08 NOTE — Progress Notes (Signed)
Was notified by OT with concerns of poor circulation to LLE. Dressing was partially removed and pulse was checked with doppler. Pulse heard bilaterally. LLE color is dusky in color. No complaints of pain or change in sensation.

## 2019-12-08 NOTE — Evaluation (Signed)
Occupational Therapy Evaluation Patient Details Name: Faith Bolton MRN: [id number] DOB: [birth year] Today's Date: 12/08/2019    History of Present Illness Pt is a 86 y/o female admitted following 2 falls. Found to have L subdural hematoma and L ankle fx. PMH includes HTN.    Clinical Impression   PTA, pt was living at home alone, she reports she was independent with ADL/IADL and modified independent with functional mobility at RW level. Pt currently requires modA to progress to EOB. Pt required assistance to don sock. Attempted sit<>stand x3, pt unable to progress into standing. Pt required maxA to return to supine. Discussed appropriate level of assistance necessary for pt at d/c with pt and daughter. Case Manager arrived at end of session to further discuss appropriate d/c plan. Due to decline in current level of function, pt would benefit from acute OT to address established goals to facilitate safe D/C to venue listed below. At this time, recommend SNF follow-up. Will continue to follow acutely.     Follow Up Recommendations  Supervision/Assistance - 24 hour;SNF    Equipment Recommendations  3 in 1 bedside commode;Wheelchair (measurements OT)    Recommendations for Other Services PT consult     Precautions / Restrictions Precautions Precautions: Fall Precaution Comments: 2 falls prior to admission.  Restrictions Weight Bearing Restrictions: Yes LLE Weight Bearing: Non weight bearing      Mobility Bed Mobility Overal bed mobility: Needs Assistance Bed Mobility: Supine to Sit;Sit to Supine     Supine to sit: Mod assist Sit to supine: Max assist   General bed mobility comments: modA to progress trunk to upright posture, maxA to return to bed for BLE management and trunk management  Transfers Overall transfer level: Needs assistance Equipment used: Rolling walker (2 wheeled) Transfers: Sit to/from Stand;Lateral/Scoot Transfers Sit to Stand: Total assist         Lateral/Scoot Transfers: Max assist General transfer comment: attempted sit<>stand x3 pt unable to clear buttocks, therapist assisted with maintaining NWB LLE;pt required maxA to scoot toward [facility] to reposition     Balance Overall balance assessment: Needs assistance Sitting-balance support: No upper extremity supported;Feet supported Sitting balance-Leahy Scale: Fair         Standing balance comment: unable to progress into standing                           ADL either performed or assessed with clinical judgement   ADL Overall ADL's : Needs assistance/impaired Eating/Feeding: Set up;Sitting   Grooming: Minimal assistance;Sitting Grooming Details (indicate cue type and reason): required assistance for brushing hair Upper Body Bathing: Min guard;Sitting   Lower Body Bathing: Moderate assistance;Sitting/lateral leans   Upper Body Dressing : Minimal assistance;Sitting   Lower Body Dressing: Moderate assistance;Sitting/lateral leans     Toilet Transfer Details (indicate cue type and reason): deferred   Toileting - Clothing Manipulation Details (indicate cue type and reason): deferred       General ADL Comments: pt limited by decreased strength/activity tolerance/endurance and NWB LLE precaution. Pt attempted to stand from EOB but was unable     Vision         Perception     Praxis      Pertinent Vitals/Pain Pain Assessment: No/denies pain Pain Intervention(s): Monitored during session     Hand Dominance Right   Extremity/Trunk Assessment Upper Extremity Assessment Upper Extremity Assessment: Generalized weakness   Lower Extremity Assessment Lower Extremity Assessment: Generalized weakness;Defer to PT evaluation;LLE  deficits/detail LLE Deficits / Details: L ankle splinted. Noted decreased perfusion in L foot while pt sitting EOB noted: purple toes, cold to touch, pt reporting sensation WNL;RN notified LLE Sensation: WNL   Cervical / Trunk  Assessment Cervical / Trunk Assessment: Other exceptions Cervical / Trunk Exceptions: per daughter, does have a hx of compression deformities.     Communication Communication Communication: HOH   Cognition Arousal/Alertness: Awake/alert Behavior During Therapy: WFL for tasks assessed/performed Overall Cognitive Status: Within Functional Limits for tasks assessed                                 General Comments: pt oriented x4, daughter present and reports improvement in cognition and pt back to baseline   General Comments  Pt's daughter present during session, discussed level of assistance pt would require should she d/c home    Exercises     Shoulder Instructions      Home Living Family/patient expects to be discharged to:: Private residence Living Arrangements: Alone Available Help at Discharge: Family Type of Home: [place]: One level     Bathroom Shower/Tub:  (sponge bathes)   Bathroom Toilet: Handicapped height     Home Equipment: Environmental consultant - 4 wheels;Walker - 2 wheels          Prior Functioning/Environment Level of Independence: Independent with assistive device(s)        Comments: However, had been having difficulty ambulating         OT Problem List: Decreased strength;Decreased range of motion;Decreased activity tolerance;Impaired balance (sitting and/or standing)      OT Treatment/Interventions: Self-care/ADL training;Therapeutic exercise;Energy conservation;DME and/or AE instruction;Therapeutic activities;Patient/family education;Balance training    OT Goals(Current goals can be found in the care plan section) Acute Rehab OT Goals Patient Stated Goal: to go home OT Goal Formulation: With patient/family Time For Goal Achievement: 12/22/19 Potential to Achieve Goals: Fair ADL Goals Pt Will Perform Grooming: with set-up;sitting Pt Will Perform Lower Body Dressing: with  supervision;sitting/lateral leans Pt Will Transfer to Toilet: with mod assist;stand pivot transfer Additional ADL Goal #1: Pt will complete bed mobiltiy at supervision level in preparation for ADL.  OT Frequency: Min 2X/week   Barriers to D/C:            Co-evaluation              AM-PAC OT "6 Clicks" Daily Activity     Outcome Measure Help from another person eating meals?: A Little Help from another person taking care of personal grooming?: A Little Help from another person toileting, which includes using toliet, bedpan, or urinal?: Total Help from another person bathing (including washing, rinsing, drying)?: A Lot Help from another person to put on and taking off regular upper body clothing?: A Little Help from another person to put on and taking off regular lower body clothing?: A Lot 6 Click Score: 14   End of Session Equipment Utilized During Treatment: Gait belt;Rolling walker;Oxygen Nurse Communication: Mobility status;Need for lift equipment  Activity Tolerance: Patient limited by fatigue;Patient tolerated treatment well Patient left: in bed;with call bell/phone within reach;with bed alarm set;with family/visitor present  OT Visit Diagnosis: Unsteadiness on feet (R26.81);Other abnormalities of gait and mobility (R26.89);Muscle weakness (generalized) (M62.81);History of falling (Z91.81)                Time: 0355-9741 OT Time Calculation (min): 33 min Charges:  OT General Charges $OT Visit: 1 Visit OT Evaluation $OT Eval Moderate Complexity: 1 Mod OT Treatments $Self Care/Home Management : 8-22 mins  Helene Kelp OTR/L Acute Rehabilitation Services Office: [phone]   Faith Bolton 12/08/2019, 2:33 PM

## 2019-12-08 NOTE — Progress Notes (Signed)
[facility] PROGRESS NOTE    Faith Bolton  TKZ:[id number] DOB: 1919/08/04 DOA: 12/05/2019 PCP: Prince Solian, MD      Brief Narrative:  Faith Bolton is a 86 y.o. F with hyponatremia, HTN who presented after a fall.  Evidently she fell the day prior to admission because her "ankle gave out".  Declined EMS transport that day, and then next day, fell again while walking to the bathroom, struck her head, and this time was transported.  In the ER, found to have a bimalleolar ankle fracture, SDH/SAH, hstrop 122, normal c-spine, and Na 126.        Assessment & Plan:  SDH Subarachnoid hemorrhage Repeat CT head shows slight interval increase in size, but no change in mentation.  Neurosurgery have reviewed this, and recommend mobilization as tolerated, and follow-up in the office with noncontrast head CT in 2 to 3 weeks.   -Hold aspirin     Fever 10/2 The patient developed fever and tachcyardia on 10/2.  This was associated with suspected infection, unclear source, possibly pneumonia vs UTI, family have asked that we not collect urine sample or perform chest x-ray due to patient's age and frailty.  She was started on Rocephin and has improved with treatment.  Despite meeting sirs criteria, I do not believe the patient was septic, and think her tachcyardia and tachypnea was related to fever alone in setting of deconditioning and advanced age.  My differential includes infection (pneumonia vs UTI) or PE.  She is not a candidate for anticoagulation given her active brain bleed.   -Continue ceftriaxone, day 2   Acute metabolic encpehalopathy Due to fever, this is resolving.  Elevated troponin Doubt ACS, troponin low and flat.  Echo without RWMA.   Ankle fracture Plans for nonoperative management, no weightbearing for 4 weeks, PT eval, and SNF placement if she does not deteriorate.  The left foot is discolored but the splint is not too tight, and the distal  blood flow by doppler is normal  Hyponatremia Chronic, asymptomatic, no change  Hypertension -Continue Avapro       Disposition: Status is: Inpatient  Remains inpatient appropriate because:She has a subdural hemorrhage, and ankle fracture requires nonweightbearing, will need full nursing cares   Dispo: The patient is from: Home              Anticipated d/c is to: SNF              Anticipated d/c date is: 1 day              Patient currently is not medically stable to d/c.    The patient was admitted with ankle fracture, intracranial hemorrhage.    The patient will need further antibiotics and discharge to SNF, likely tomorrow          MDM: The below labs and imaging reports were reviewed and summarized above.  Medication management as above.   DVT prophylaxis: SCDs Start: 12/05/19 2151  Code Status: DNr Family Communication: Daughter at the bedside           Subjective: Mentation slightly ebtter today.  No pain in left ankle.  No cough, no dysuria, no dyspnea, no urinary frequency, no vomiting.       Objective: Vitals:   12/08/19 0345 12/08/19 0829 12/08/19 1210 12/08/19 1530  BP: (!) 152/80 135/69 133/68 136/64  Pulse: (!) 110 87 90 82  Resp: 19 (!) 23 20 20   Temp: 98 F (36.7 C)  97.6 F (36.4 C) 98.2 F (36.8 C) 97.9 F (36.6 C)  TempSrc: Oral Oral Axillary Axillary  SpO2: 92% 90% 95% 99%  Weight:      Height:        Intake/Output Summary (Last 24 hours) at 12/08/2019 1720 Last data filed at 12/08/2019 1601 Gross per 24 hour  Intake 400 ml  Output --  Net 400 ml   Filed Weights   12/05/19 1800 12/07/19 0500  Weight: 74.8 kg 71.9 kg    Examination: General appearance: Elderly female, lying in bed, appears tired. More alert and responsive today HEENT:    Skin:  Cardiac: RRR, no murmurs, JVP not visible, no lower extremity edema Respiratory: Normal respiratory rate and rhythm.  CTAB without rales or wheezes.  Diminished  overall. Abdomen: Abdomen soft.  No TTP or guarding. No ascites, distension, hepatosplenomegaly.   MSK: No deformities or effusions.  The left ankle is splinted.  The toes are red and swollen but pulses are good. Neuro: Awake but sleepy.  moves upper extremities with generalized weakness, normal coordination. Speech fluent.    Psych: attention diminished  Judgment and insight appear impaired.    Data Reviewed: I have personally reviewed following labs and imaging studies:  CBC: Recent Labs  Lab 12/05/19 1815 12/06/19 0435  WBC 9.1 7.9  NEUTROABS 7.1  --   HGB 13.2 11.9*  HCT 38.7 34.1*  MCV 90.2 90.7  PLT 282 762   Basic Metabolic Panel: Recent Labs  Lab 12/05/19 1815 12/06/19 0435  NA 126* 126*  K 4.7 3.9  CL 89* 91*  CO2 24 22  GLUCOSE 169* 150*  BUN 9 6*  CREATININE 0.81 0.74  CALCIUM 10.0 9.2   GFR: Estimated Creatinine Clearance: 38.2 mL/min (by C-G formula based on SCr of 0.74 mg/dL). Liver Function Tests: Recent Labs  Lab 12/05/19 1815  AST 42*  ALT 17  ALKPHOS 95  BILITOT 1.0  PROT 6.9  ALBUMIN 3.6   No results for input(s): LIPASE, AMYLASE in the last 168 hours. No results for input(s): AMMONIA in the last 168 hours. Coagulation Profile: Recent Labs  Lab 12/06/19 0435  INR 1.0   Cardiac Enzymes: No results for input(s): CKTOTAL, CKMB, CKMBINDEX, TROPONINI in the last 168 hours. BNP (last 3 results) No results for input(s): PROBNP in the last 8760 hours. HbA1C: No results for input(s): HGBA1C in the last 72 hours. CBG: No results for input(s): GLUCAP in the last 168 hours. Lipid Profile: No results for input(s): CHOL, HDL, LDLCALC, TRIG, CHOLHDL, LDLDIRECT in the last 72 hours. Thyroid Function Tests: No results for input(s): TSH, T4TOTAL, FREET4, T3FREE, THYROIDAB in the last 72 hours. Anemia Panel: No results for input(s): VITAMINB12, FOLATE, FERRITIN, TIBC, IRON, RETICCTPCT in the last 72 hours. Urine analysis:    Component Value  Date/Time   COLORURINE YELLOW 12/05/2019 1925   APPEARANCEUR HAZY (A) 12/05/2019 1925   LABSPEC 1.009 12/05/2019 1925   PHURINE 7.0 12/05/2019 1925   GLUCOSEU NEGATIVE 12/05/2019 1925   HGBUR NEGATIVE 12/05/2019 1925   BILIRUBINUR NEGATIVE 12/05/2019 1925   KETONESUR NEGATIVE 12/05/2019 1925   PROTEINUR 30 (A) 12/05/2019 1925   UROBILINOGEN 0.2 01/28/2007 1741   NITRITE NEGATIVE 12/05/2019 1925   LEUKOCYTESUR NEGATIVE 12/05/2019 1925   Sepsis Labs: @LABRCNTIP (procalcitonin:4,lacticacidven:4)  ) Recent Results (from the past 240 hour(s))  Respiratory Panel by RT PCR (Flu A&B, Covid) - Nasopharyngeal Swab     Status: None   Collection Time: 12/05/19  8:16 PM  Specimen: Nasopharyngeal Swab  Result Value Ref Range Status   SARS Coronavirus 2 by RT PCR NEGATIVE NEGATIVE Final    Comment: (NOTE) SARS-CoV-2 target nucleic acids are NOT DETECTED.  The SARS-CoV-2 RNA is generally detectable in upper respiratoy specimens during the acute phase of infection. The lowest concentration of SARS-CoV-2 viral copies this assay can detect is 131 copies/mL. A negative result does not preclude SARS-Cov-2 infection and should not be used as the sole basis for treatment or other patient management decisions. A negative result may occur with  improper specimen collection/handling, submission of specimen other than nasopharyngeal swab, presence of viral mutation(s) within the areas targeted by this assay, and inadequate number of viral copies (<131 copies/mL). A negative result must be combined with clinical observations, patient history, and epidemiological information. The expected result is Negative.  Fact Sheet for Patients:  PinkCheek.be  Fact Sheet for Healthcare Providers:  GravelBags.it  This test is no t yet approved or cleared by the [place] FDA and  has been authorized for detection and/or diagnosis of SARS-CoV-2 by FDA  under an Emergency Use Authorization (EUA). This EUA will remain  in effect (meaning this test can be used) for the duration of the COVID-19 declaration under Section 564(b)(1) of the Act, 21 U.S.C. section 360bbb-3(b)(1), unless the authorization is terminated or revoked sooner.     Influenza A by PCR NEGATIVE NEGATIVE Final   Influenza B by PCR NEGATIVE NEGATIVE Final    Comment: (NOTE) The Xpert Xpress SARS-CoV-2/FLU/RSV assay is intended as an aid in  the diagnosis of influenza from Nasopharyngeal swab specimens and  should not be used as a sole basis for treatment. Nasal washings and  aspirates are unacceptable for Xpert Xpress SARS-CoV-2/FLU/RSV  testing.  Fact Sheet for Patients: PinkCheek.be  Fact Sheet for Healthcare Providers: GravelBags.it  This test is not yet approved or cleared by the [place] FDA and  has been authorized for detection and/or diagnosis of SARS-CoV-2 by  FDA under an Emergency Use Authorization (EUA). This EUA will remain  in effect (meaning this test can be used) for the duration of the  Covid-19 declaration under Section 564(b)(1) of the Act, 21  U.S.C. section 360bbb-3(b)(1), unless the authorization is  terminated or revoked. Performed at [facility], [place] [address], [place], [place] [address]   Culture, blood (routine x 2)     Status: None (Preliminary result)   Collection Time: 12/07/19  4:32 PM   Specimen: BLOOD  Result Value Ref Range Status   Specimen Description BLOOD LEFT ANTECUBITAL  Final   Special Requests   Final    BOTTLES DRAWN AEROBIC AND ANAEROBIC Blood Culture results may not be optimal due to an inadequate volume of blood received in culture bottles   Culture   Final    NO GROWTH < 24 HOURS Performed at [facility], [place] [address] [place], [place] [address]    Report Status PENDING  Incomplete  Culture, blood (routine x 2)     Status: None  (Preliminary result)   Collection Time: 12/07/19  4:32 PM   Specimen: BLOOD  Result Value Ref Range Status   Specimen Description BLOOD LEFT ANTECUBITAL  Final   Special Requests   Final    BOTTLES DRAWN AEROBIC ONLY Blood Culture results may not be optimal due to an inadequate volume of blood received in culture bottles   Culture   Final    NO GROWTH < 24 HOURS Performed at  [facility], [place] [address] [place], [place] [address]    Report Status PENDING  Incomplete         Radiology Studies: CT HEAD WO CONTRAST  Result Date: 12/07/2019 CLINICAL DATA:  Follow-up subarachnoid hemorrhage EXAM: CT HEAD WITHOUT CONTRAST TECHNIQUE: Contiguous axial images were obtained from the base of the skull through the vertex without intravenous contrast. COMPARISON:  12/06/2019 FINDINGS: Brain: Unchanged subdural hematoma about the left hemispheric vertex measuring up to 7 mm in thickness (series 5, image 42). Unchanged minimal subdural hemorrhage about the left aspect of the falx (series 4, image 22). Unchanged foci of subarachnoid hemorrhage within the sulci about the left parietal vertex (series 4, image 24). No significant mass effect midline shift. Periventricular and deep white matter hypodensity with mild global volume loss. No evidence of acute infarction or hydrocephalus. Vascular: No hyperdense vessel or unexpected calcification. Skull: Normal. Negative for fracture or focal lesion. Sinuses/Orbits: Total opacification of the partially imaged right maxillary sinus with a calcified lesion at the antrum (series 3, image 5). Other: None. IMPRESSION: 1. Unchanged subdural hematoma about the left hemispheric vertex measuring up to 7 mm in thickness. Unchanged minimal subdural hemorrhage about the left aspect of the falx. 2. Unchanged foci of subarachnoid hemorrhage within the sulci about the left parietal vertex. 3. No significant mass effect or midline shift. 4. Small-vessel white matter disease and  mild global volume loss in keeping with advanced patient age. 5. Total opacification of the partially imaged right maxillary sinus with a calcified lesion at the antrum. Electronically Signed   By: Eddie Candle M.D.   On: 12/07/2019 14:47        Scheduled Meds: . irbesartan  150 mg Oral Daily  . pantoprazole  40 mg Oral Daily  . sodium chloride flush  3 mL Intravenous Q12H   Continuous Infusions: . cefTRIAXone (ROCEPHIN)  IV 1 g (12/08/19 1601)     LOS: 3 days    Time spent: 25 minutes    Edwin Dada, MD [facility] 12/08/2019, 5:20 PM     Please page though [place] or Epic secure chat:  For [employer], Adult nurse

## 2019-12-09 NOTE — Progress Notes (Signed)
Subjective: Patient reports that "I'm doing ok." She does report that she feels sleepy but otherwise she thinks she is at her baseline.   Objective: Vital signs in last 24 hours: Temp:  [97.6 F (36.4 C)-98.5 F (36.9 C)] 98.5 F (36.9 C) (10/04 0720) Pulse Rate:  [82-93] 93 (10/04 0720) Resp:  [16-23] 16 (10/04 0720) BP: (123-154)/(63-78) 154/67 (10/04 0720) SpO2:  [90 %-99 %] 98 % (10/04 0720) Weight:  [71.2 kg] 71.2 kg (10/04 0500)  Intake/Output from previous day: 10/03 0701 - 10/04 0700 In: 400 [P.O.:300; IV Piggyback:100] Out: -  Intake/Output this shift: Total I/O In: -  Out: 700 [Urine:700]  Physical Exam Constitutional:      General: She is not in acute distress.    Appearance: Normal appearance.  Eyes:     Extraocular Movements: Extraocular movements intact.     Pupils: Pupils are equal, round, and reactive to light.  Neurological:     General: No focal deficit present.     Mental Status: She is alert and oriented to person, place, and time. Mental status is at baseline.     Cranial Nerves: No cranial nerve deficit.     Sensory: No sensory deficit.     Motor: No weakness.     Coordination: Coordination normal.     Deep Tendon Reflexes: Reflexes normal.  Psychiatric:        Mood and Affect: Mood normal.        Behavior: Behavior normal.        Thought Content: Thought content normal.        Judgment: Judgment normal.   Lab Results: No results for input(s): WBC, HGB, HCT, PLT in the last 72 hours. BMET No results for input(s): NA, K, CL, CO2, GLUCOSE, BUN, CREATININE, CALCIUM in the last 72 hours.  Studies/Results: CT HEAD WO CONTRAST  Result Date: 12/07/2019 CLINICAL DATA:  Follow-up subarachnoid hemorrhage EXAM: CT HEAD WITHOUT CONTRAST TECHNIQUE: Contiguous axial images were obtained from the base of the skull through the vertex without intravenous contrast. COMPARISON:  12/06/2019 FINDINGS: Brain: Unchanged subdural hematoma about the left hemispheric  vertex measuring up to 7 mm in thickness (series 5, image 42). Unchanged minimal subdural hemorrhage about the left aspect of the falx (series 4, image 22). Unchanged foci of subarachnoid hemorrhage within the sulci about the left parietal vertex (series 4, image 24). No significant mass effect midline shift. Periventricular and deep white matter hypodensity with mild global volume loss. No evidence of acute infarction or hydrocephalus. Vascular: No hyperdense vessel or unexpected calcification. Skull: Normal. Negative for fracture or focal lesion. Sinuses/Orbits: Total opacification of the partially imaged right maxillary sinus with a calcified lesion at the antrum (series 3, image 5). Other: None. IMPRESSION: 1. Unchanged subdural hematoma about the left hemispheric vertex measuring up to 7 mm in thickness. Unchanged minimal subdural hemorrhage about the left aspect of the falx. 2. Unchanged foci of subarachnoid hemorrhage within the sulci about the left parietal vertex. 3. No significant mass effect or midline shift. 4. Small-vessel white matter disease and mild global volume loss in keeping with advanced patient age. 5. Total opacification of the partially imaged right maxillary sinus with a calcified lesion at the antrum. Electronically Signed   By: Eddie Candle M.D.   On: 12/07/2019 14:47    Assessment/Plan: Over the weekend, she had an acute decline in her mental status after a dose of oxycodone. She was latter noted to be febrile and tachycardic. Blood and urine cultures  were ordered and she was started on ceftriaxone for UTI. Left ankle fracture is non-operative with plan to be nonweightbearing for 4 weeks and physical therapy. Continue to hold aspirin. The patient continues to be stable from a neurosurgical perspective and there is no new input to add at this time. Will continue to follow. Agree with plan to discharge to SNF once medically stable. Follow up in office with non-contrast head CT in 2- 3  weeks. I appreciate TRH help in managing this patient.    LOS: 4 days    Marvis Moeller, DNP, NP-C 12/09/2019, 8:29 AM

## 2019-12-09 NOTE — TOC Progression Note (Signed)
Transition of Care [facility]) - Progression Note    Patient Details  Name: Faith Bolton MRN: [id number] Date of Birth: Aug 31, 1919  Transition of Care [facility]) CM/SW [place], [place] Phone Number: 12/09/2019, 11:14 AM  Clinical Narrative:   CSW met with patient and daughter June at bedside, as well as daughter Tye [place] via phone, to discuss bed offers for SNF. Family is not aware of SNF choices and will need to do research. CSW informed daughters that patient is likely ready to transition to SNF as soon as one is chosen, and they will get back to CSW with choice as soon as possible. CSW answered questions about expectations at SNF, and ensured the patient had no other questions. Daughters have CSW contact information for other questions and concerns. CSW to follow.    Expected Discharge Plan: [place] Barriers to Discharge: Continued Medical Work up, SNF Pending bed offer  Expected Discharge Plan and Services Expected Discharge Plan: [place] In-house Referral: Clinical Social Work Discharge Planning Services: CM Consult Post Acute Care Choice: [place] Living arrangements for the past 2 months: Single Family Home                                       Social Determinants of Health (SDOH) Interventions    Readmission Risk Interventions Readmission Risk Prevention Plan 12/07/2019  Post Dischage Appt Not Complete  Appt Comments rec for SNF placement  Medication Screening Complete  Transportation Screening Complete  Some recent data might be hidden

## 2019-12-09 NOTE — Progress Notes (Signed)
[facility] PROGRESS NOTE    Faith Bolton  WTU:[id number] DOB: [birth year] DOA: 12/05/2019 PCP: Prince Solian, MD      Brief Narrative:  Faith Bolton is a 86 y.o. F with hyponatremia, HTN who presented after a fall.  Evidently she fell the day prior to admission because her "ankle gave out".  Declined EMS transport that day, and then next day, fell again while walking to the bathroom, struck her head, and this time was transported.  In the ER, found to have a bimalleolar ankle fracture, SDH/SAH, hstrop 122, normal c-spine, and Na 126.        Assessment & Plan:  SDH Subarachnoid hemorrhage -Hold aspirin -Repeat CT head in 2-3 weeks with NSG in office      Community acquired pneumonia Patient deteriorated and had mental status changes on HD 2 after a second dose of oxycodone.  Blood cultures were grown but have no growth.  CXR and Urine culture was deferred due to family/patient request at the patients' age, after discussion of risks benefits.   She is improving on empiric ceftriaxone -Continue CTX, day 3   Acute metabolic encephalopathy Resolved  Ankle fracture Plans for nonoperative management, no weightbearing for 4 weeks, PT eval, and SNF placement if she does not deteriorate.  The left foot is discolored but the splint is not too tight, and the distal blood flow by doppler is normal -Xray in 1 week to ensure no displacementIf this displaces that would increase her risk for wound complications and potentially skin necrosis, and at that point we would need to consider surgery. -nonweightbearing in a short leg splint for 6 weeks total. -transition her into a cast at 2 weeks. I would like to get a repeat x-ray in 1 week.   Hyponatremia Chronic, asymptomatic, no change  Hypertension -Continue Avapro       Disposition: Status is: Inpatient  Remains inpatient appropriate because:She has a subdural hemorrhage, and ankle fracture  requires nonweightbearing, will need full nursing cares   Dispo: The patient is from: Home              Anticipated d/c is to: SNF              Anticipated d/c date is: 1 day              Patient currently is not medically stable to d/c.    The patient was admitted with ankle fracture, intracranial hemorrhage.    The patient is nonweightbearing, will need SNF rehabilitation, likely tomorrow.          MDM: The below labs and imaging reports were reviewed and summarized above.  Medication management as above.   DVT prophylaxis: SCDs Start: 12/05/19 2151  Code Status: DNr Family Communication: Daughter at the bedside           Subjective: Patient now mentating at baseline, taking orals.  Temp < 100 F, heart rate < 100bpm, RR < 24, SpO2 at baseline.     No cough, no dysuria, no dyspnea, no urinary frequency, no vomiting.       Objective: Vitals:   12/09/19 0500 12/09/19 0720 12/09/19 1144 12/09/19 1650  BP:  (!) 154/67 118/60 94/78  Pulse:  93 87 85  Resp:  16 18 18   Temp:  98.5 F (36.9 C) 98.9 F (37.2 C) 98.6 F (37 C)  TempSrc:  Oral Axillary Axillary  SpO2:  98% 99% 95%  Weight: 71.2 kg  Height:        Intake/Output Summary (Last 24 hours) at 12/09/2019 1854 Last data filed at 12/09/2019 1300 Gross per 24 hour  Intake 300 ml  Output 700 ml  Net -400 ml   Filed Weights   12/05/19 1800 12/07/19 0500 12/09/19 0500  Weight: 74.8 kg 71.9 kg 71.2 kg    Examination: General appearance: Elderly female, lying in bed, appears tired. More alert and responsive today HEENT:    Skin:  Cardiac: RRR, no murmurs, JVP not visible, no lower extremity edema Respiratory: Normal respiratory rate and rhythm.  CTAB without rales or wheezes.  Diminished overall. Abdomen: Abdomen soft.  No TTP or guarding. No ascites, distension, hepatosplenomegaly.   MSK: No deformities or effusions.  The left ankle is splinted.  The toes are red and swollen but pulses are  good. Neuro: Awake but sleepy.  moves upper extremities with generalized weakness, normal coordination. Speech fluent.    Psych: attention diminished  Judgment and insight appear impaired.    Data Reviewed: I have personally reviewed following labs and imaging studies:  CBC: Recent Labs  Lab 12/05/19 1815 12/06/19 0435  WBC 9.1 7.9  NEUTROABS 7.1  --   HGB 13.2 11.9*  HCT 38.7 34.1*  MCV 90.2 90.7  PLT 282 938   Basic Metabolic Panel: Recent Labs  Lab 12/05/19 1815 12/06/19 0435  NA 126* 126*  K 4.7 3.9  CL 89* 91*  CO2 24 22  GLUCOSE 169* 150*  BUN 9 6*  CREATININE 0.81 0.74  CALCIUM 10.0 9.2   GFR: Estimated Creatinine Clearance: 38 mL/min (by C-G formula based on SCr of 0.74 mg/dL). Liver Function Tests: Recent Labs  Lab 12/05/19 1815  AST 42*  ALT 17  ALKPHOS 95  BILITOT 1.0  PROT 6.9  ALBUMIN 3.6   No results for input(s): LIPASE, AMYLASE in the last 168 hours. No results for input(s): AMMONIA in the last 168 hours. Coagulation Profile: Recent Labs  Lab 12/06/19 0435  INR 1.0   Cardiac Enzymes: No results for input(s): CKTOTAL, CKMB, CKMBINDEX, TROPONINI in the last 168 hours. BNP (last 3 results) No results for input(s): PROBNP in the last 8760 hours. HbA1C: No results for input(s): HGBA1C in the last 72 hours. CBG: No results for input(s): GLUCAP in the last 168 hours. Lipid Profile: No results for input(s): CHOL, HDL, LDLCALC, TRIG, CHOLHDL, LDLDIRECT in the last 72 hours. Thyroid Function Tests: No results for input(s): TSH, T4TOTAL, FREET4, T3FREE, THYROIDAB in the last 72 hours. Anemia Panel: No results for input(s): VITAMINB12, FOLATE, FERRITIN, TIBC, IRON, RETICCTPCT in the last 72 hours. Urine analysis:    Component Value Date/Time   COLORURINE YELLOW 12/05/2019 1925   APPEARANCEUR HAZY (A) 12/05/2019 1925   LABSPEC 1.009 12/05/2019 1925   PHURINE 7.0 12/05/2019 1925   GLUCOSEU NEGATIVE 12/05/2019 1925   HGBUR NEGATIVE  12/05/2019 1925   BILIRUBINUR NEGATIVE 12/05/2019 1925   KETONESUR NEGATIVE 12/05/2019 1925   PROTEINUR 30 (A) 12/05/2019 1925   UROBILINOGEN 0.2 01/28/2007 1741   NITRITE NEGATIVE 12/05/2019 1925   LEUKOCYTESUR NEGATIVE 12/05/2019 1925   Sepsis Labs: @LABRCNTIP (procalcitonin:4,lacticacidven:4)  ) Recent Results (from the past 240 hour(s))  Respiratory Panel by RT PCR (Flu A&B, Covid) - Nasopharyngeal Swab     Status: None   Collection Time: 12/05/19  8:16 PM   Specimen: Nasopharyngeal Swab  Result Value Ref Range Status   SARS Coronavirus 2 by RT PCR NEGATIVE NEGATIVE Final    Comment: (NOTE) SARS-CoV-2  target nucleic acids are NOT DETECTED.  The SARS-CoV-2 RNA is generally detectable in upper respiratoy specimens during the acute phase of infection. The lowest concentration of SARS-CoV-2 viral copies this assay can detect is 131 copies/mL. A negative result does not preclude SARS-Cov-2 infection and should not be used as the sole basis for treatment or other patient management decisions. A negative result may occur with  improper specimen collection/handling, submission of specimen other than nasopharyngeal swab, presence of viral mutation(s) within the areas targeted by this assay, and inadequate number of viral copies (<131 copies/mL). A negative result must be combined with clinical observations, patient history, and epidemiological information. The expected result is Negative.  Fact Sheet for Patients:  PinkCheek.be  Fact Sheet for Healthcare Providers:  GravelBags.it  This test is no t yet approved or cleared by the [place] FDA and  has been authorized for detection and/or diagnosis of SARS-CoV-2 by FDA under an Emergency Use Authorization (EUA). This EUA will remain  in effect (meaning this test can be used) for the duration of the COVID-19 declaration under Section 564(b)(1) of the Act, 21  U.S.C. section 360bbb-3(b)(1), unless the authorization is terminated or revoked sooner.     Influenza A by PCR NEGATIVE NEGATIVE Final   Influenza B by PCR NEGATIVE NEGATIVE Final    Comment: (NOTE) The Xpert Xpress SARS-CoV-2/FLU/RSV assay is intended as an aid in  the diagnosis of influenza from Nasopharyngeal swab specimens and  should not be used as a sole basis for treatment. Nasal washings and  aspirates are unacceptable for Xpert Xpress SARS-CoV-2/FLU/RSV  testing.  Fact Sheet for Patients: PinkCheek.be  Fact Sheet for Healthcare Providers: GravelBags.it  This test is not yet approved or cleared by the [place] FDA and  has been authorized for detection and/or diagnosis of SARS-CoV-2 by  FDA under an Emergency Use Authorization (EUA). This EUA will remain  in effect (meaning this test can be used) for the duration of the  Covid-19 declaration under Section 564(b)(1) of the Act, 21  U.S.C. section 360bbb-3(b)(1), unless the authorization is  terminated or revoked. Performed at [facility], [place] [address] [place], [place] [address]   Culture, blood (routine x 2)     Status: None (Preliminary result)   Collection Time: 12/07/19  4:32 PM   Specimen: BLOOD  Result Value Ref Range Status   Specimen Description BLOOD LEFT ANTECUBITAL  Final   Special Requests   Final    BOTTLES DRAWN AEROBIC AND ANAEROBIC Blood Culture results may not be optimal due to an inadequate volume of blood received in culture bottles   Culture   Final    NO GROWTH 2 DAYS Performed at [facility], [place] [address], [place], [place] [address]    Report Status PENDING  Incomplete  Culture, blood (routine x 2)     Status: None (Preliminary result)   Collection Time: 12/07/19  4:32 PM   Specimen: BLOOD  Result Value Ref Range Status   Specimen Description BLOOD LEFT ANTECUBITAL  Final   Special Requests   Final    BOTTLES  DRAWN AEROBIC ONLY Blood Culture results may not be optimal due to an inadequate volume of blood received in culture bottles   Culture   Final    NO GROWTH 2 DAYS Performed at [facility], [place] [address], [place],  [address]    Report Status PENDING  Incomplete         Radiology  Studies: No results found.      Scheduled Meds: . irbesartan  150 mg Oral Daily  . pantoprazole  40 mg Oral Daily  . sodium chloride flush  3 mL Intravenous Q12H   Continuous Infusions: . cefTRIAXone (ROCEPHIN)  IV 1 g (12/09/19 1532)     LOS: 4 days    Time spent: 25 minutes    Edwin Dada, MD [facility] 12/09/2019, 6:54 PM     Please page though [place] or Epic secure chat:  For [employer], Adult nurse

## 2019-12-10 DIAGNOSIS — J189 Pneumonia, unspecified organism: Secondary | ICD-10-CM | POA: Diagnosis not present

## 2019-12-10 DIAGNOSIS — S066X0D Traumatic subarachnoid hemorrhage without loss of consciousness, subsequent encounter: Secondary | ICD-10-CM | POA: Diagnosis not present

## 2019-12-10 DIAGNOSIS — W19XXXA Unspecified fall, initial encounter: Secondary | ICD-10-CM | POA: Diagnosis not present

## 2019-12-10 DIAGNOSIS — M2578 Osteophyte, vertebrae: Secondary | ICD-10-CM | POA: Diagnosis not present

## 2019-12-10 DIAGNOSIS — E871 Hypo-osmolality and hyponatremia: Secondary | ICD-10-CM | POA: Diagnosis not present

## 2019-12-10 DIAGNOSIS — R059 Cough, unspecified: Secondary | ICD-10-CM | POA: Diagnosis not present

## 2019-12-10 DIAGNOSIS — R29898 Other symptoms and signs involving the musculoskeletal system: Secondary | ICD-10-CM | POA: Diagnosis not present

## 2019-12-10 DIAGNOSIS — J90 Pleural effusion, not elsewhere classified: Secondary | ICD-10-CM | POA: Diagnosis not present

## 2019-12-10 DIAGNOSIS — R262 Difficulty in walking, not elsewhere classified: Secondary | ICD-10-CM | POA: Diagnosis not present

## 2019-12-10 DIAGNOSIS — R2681 Unsteadiness on feet: Secondary | ICD-10-CM | POA: Diagnosis not present

## 2019-12-10 DIAGNOSIS — Z7401 Bed confinement status: Secondary | ICD-10-CM | POA: Diagnosis not present

## 2019-12-10 DIAGNOSIS — S82842A Displaced bimalleolar fracture of left lower leg, initial encounter for closed fracture: Secondary | ICD-10-CM | POA: Diagnosis not present

## 2019-12-10 DIAGNOSIS — Z9181 History of falling: Secondary | ICD-10-CM | POA: Diagnosis not present

## 2019-12-10 DIAGNOSIS — M6281 Muscle weakness (generalized): Secondary | ICD-10-CM | POA: Diagnosis not present

## 2019-12-10 DIAGNOSIS — R41841 Cognitive communication deficit: Secondary | ICD-10-CM | POA: Diagnosis not present

## 2019-12-10 DIAGNOSIS — R0902 Hypoxemia: Secondary | ICD-10-CM | POA: Diagnosis not present

## 2019-12-10 DIAGNOSIS — M5136 Other intervertebral disc degeneration, lumbar region: Secondary | ICD-10-CM | POA: Diagnosis not present

## 2019-12-10 DIAGNOSIS — H3530 Unspecified macular degeneration: Secondary | ICD-10-CM | POA: Diagnosis not present

## 2019-12-10 DIAGNOSIS — R627 Adult failure to thrive: Secondary | ICD-10-CM | POA: Diagnosis not present

## 2019-12-10 DIAGNOSIS — M2550 Pain in unspecified joint: Secondary | ICD-10-CM | POA: Diagnosis not present

## 2019-12-10 DIAGNOSIS — R5381 Other malaise: Secondary | ICD-10-CM | POA: Diagnosis not present

## 2019-12-10 DIAGNOSIS — K219 Gastro-esophageal reflux disease without esophagitis: Secondary | ICD-10-CM | POA: Diagnosis not present

## 2019-12-10 DIAGNOSIS — Z79899 Other long term (current) drug therapy: Secondary | ICD-10-CM | POA: Diagnosis not present

## 2019-12-10 DIAGNOSIS — R531 Weakness: Secondary | ICD-10-CM | POA: Diagnosis present

## 2019-12-10 DIAGNOSIS — M1990 Unspecified osteoarthritis, unspecified site: Secondary | ICD-10-CM | POA: Diagnosis not present

## 2019-12-10 DIAGNOSIS — M4802 Spinal stenosis, cervical region: Secondary | ICD-10-CM | POA: Diagnosis not present

## 2019-12-10 DIAGNOSIS — R200 Anesthesia of skin: Secondary | ICD-10-CM | POA: Diagnosis not present

## 2019-12-10 DIAGNOSIS — R778 Other specified abnormalities of plasma proteins: Secondary | ICD-10-CM | POA: Diagnosis not present

## 2019-12-10 DIAGNOSIS — S065X0A Traumatic subdural hemorrhage without loss of consciousness, initial encounter: Secondary | ICD-10-CM | POA: Diagnosis not present

## 2019-12-10 DIAGNOSIS — Z20822 Contact with and (suspected) exposure to covid-19: Secondary | ICD-10-CM | POA: Diagnosis not present

## 2019-12-10 DIAGNOSIS — S065X0D Traumatic subdural hemorrhage without loss of consciousness, subsequent encounter: Secondary | ICD-10-CM | POA: Diagnosis not present

## 2019-12-10 DIAGNOSIS — R29810 Facial weakness: Secondary | ICD-10-CM | POA: Diagnosis not present

## 2019-12-10 DIAGNOSIS — Z66 Do not resuscitate: Secondary | ICD-10-CM | POA: Diagnosis not present

## 2019-12-10 DIAGNOSIS — Z859 Personal history of malignant neoplasm, unspecified: Secondary | ICD-10-CM | POA: Diagnosis not present

## 2019-12-10 DIAGNOSIS — I69828 Other speech and language deficits following other cerebrovascular disease: Secondary | ICD-10-CM | POA: Diagnosis not present

## 2019-12-10 DIAGNOSIS — M546 Pain in thoracic spine: Secondary | ICD-10-CM | POA: Diagnosis not present

## 2019-12-10 DIAGNOSIS — I10 Essential (primary) hypertension: Secondary | ICD-10-CM | POA: Diagnosis not present

## 2019-12-10 DIAGNOSIS — H5316 Psychophysical visual disturbances: Secondary | ICD-10-CM | POA: Diagnosis not present

## 2019-12-10 DIAGNOSIS — S82842D Displaced bimalleolar fracture of left lower leg, subsequent encounter for closed fracture with routine healing: Secondary | ICD-10-CM | POA: Diagnosis not present

## 2019-12-10 DIAGNOSIS — R4781 Slurred speech: Secondary | ICD-10-CM | POA: Diagnosis not present

## 2019-12-10 DIAGNOSIS — R29818 Other symptoms and signs involving the nervous system: Secondary | ICD-10-CM | POA: Diagnosis not present

## 2019-12-10 DIAGNOSIS — S066X0A Traumatic subarachnoid hemorrhage without loss of consciousness, initial encounter: Secondary | ICD-10-CM | POA: Diagnosis not present

## 2019-12-10 DIAGNOSIS — M4804 Spinal stenosis, thoracic region: Secondary | ICD-10-CM | POA: Diagnosis not present

## 2019-12-10 LAB — RESPIRATORY PANEL BY RT PCR (FLU A&B, COVID)
Influenza A by PCR: NEGATIVE
Influenza B by PCR: NEGATIVE
SARS Coronavirus 2 by RT PCR: NEGATIVE

## 2019-12-10 MED ORDER — CEFDINIR 300 MG PO CAPS: 300.0000 mg | ORAL_CAPSULE | Freq: Two times a day (BID) | ORAL | 0 refills | Status: AC

## 2019-12-10 NOTE — Care Management Important Message (Signed)
Important Message  Patient Details  Name: ESTALENE BERGEY MRN: [id number] Date of Birth: 12-08-1919   Medicare Important Message Given:  Yes  Patient was discharge prior to IM delivery.  IM document was mailed to the patient home address.    Ewan Grau 12/10/2019, 3:54 PM

## 2019-12-10 NOTE — Progress Notes (Signed)
Pt was tired but willing to work, and used care to avoid stress on LLE lower leg structure to do BLE  Exercises.  Her plan is still SNF since standing and mobility to return home are a struggle with her strength and mobility tolerance.  Follow acutely to progress as able to shorten length of rehab stay.   12/09/19 1730  PT Visit Information  Last PT Received On 12/09/19  Assistance Needed +2  History of Present Illness Pt is a 86 y/o female admitted following 2 falls. Found to have L subdural hematoma and L ankle fx. PMH includes HTN.   Subjective Data  Subjective reports she is not strong enough to try to stand but will sit up  Patient Stated Goal to go home  Precautions  Precautions Fall  Restrictions  Weight Bearing Restrictions Yes  LLE Weight Bearing NWB  Pain Assessment  Pain Assessment No/denies pain  Cognition  Arousal/Alertness Awake/alert  Behavior During Therapy Flat affect  Overall Cognitive Status Within Functional Limits for tasks assessed  Area of Impairment Problem solving  Problem Solving Slow processing;Requires verbal cues;Requires tactile cues  General Comments daughter talking about her trip to SNF and getting a decision made  Bed Mobility  Overal bed mobility Needs Assistance  Bed Mobility Supine to Sit;Sit to Supine  Rolling Max assist  Supine to sit Max assist  Transfers  General transfer comment did not want to attempt  Exercises  Exercises General Lower Extremity  General Exercises - Lower Extremity  Ankle Circles/Pumps AROM;Right  Long Arc Quad Strengthening;Both;10 reps  Heel Slides Strengthening;Both;10 reps  Hip ABduction/ADduction Strengthening;Both;10 reps  Hip Flexion/Marching AROM;10 reps  PT - End of Session  Activity Tolerance Patient tolerated treatment well  Patient left in bed;with call bell/phone within reach;with bed alarm set;with family/visitor present  Nurse Communication Mobility status   PT - Assessment/Plan  PT Plan Current  plan remains appropriate  PT Visit Diagnosis Muscle weakness (generalized) (M62.81);Pain;Difficulty in walking, not elsewhere classified (R26.2);History of falling (Z91.81);Repeated falls (R29.6)  Pain - Right/Left Left  Pain - part of body Ankle and joints of foot  PT Frequency (ACUTE ONLY) Min 2X/week  Follow Up Recommendations SNF  PT equipment Wheelchair cushion (measurements PT);Wheelchair (measurements PT)  AM-PAC PT "6 Clicks" Mobility Outcome Measure (Version 2)  Help needed turning from your back to your side while in a flat bed without using bedrails? 2  Help needed moving from lying on your back to sitting on the side of a flat bed without using bedrails? 2  Help needed moving to and from a bed to a chair (including a wheelchair)? 1  Help needed standing up from a chair using your arms (e.g., wheelchair or bedside chair)? 1  Help needed to walk in hospital room? 1  Help needed climbing 3-5 steps with a railing?  1  6 Click Score 8  Consider Recommendation of Discharge To: CIR/SNF/LTACH  PT Time Calculation  PT Start Time (ACUTE ONLY) 1655  PT Stop Time (ACUTE ONLY) 1726  PT Time Calculation (min) (ACUTE ONLY) 31 min  PT General Charges  $$ ACUTE PT VISIT 1 Visit    Mee Hives, PT MS Acute Rehab Dept. Number: [place] [facility] [place]

## 2019-12-10 NOTE — TOC Transition Note (Signed)
Transition of Care [facility]) - CM/SW Discharge Note   Patient Details  Name: Faith Bolton MRN: [id number] Date of Birth: Dec 24, 1919  Transition of Care [facility]) CM/SW Contact:  Marney Setting, [place] Work Phone Number: 12/10/2019, 12:33 PM   Clinical Narrative:   Nurse to call report to  [phone]. Rm. [phone]    Final next level of care: Skilled Nursing Facility Barriers to Discharge: Barriers Resolved   Patient Goals and CMS Choice Patient states their goals for this hospitalization and ongoing recovery are:: for her to be safe when returning home CMS Medicare.gov Compare Post Acute Care list provided to:: Patient Choice offered to / list presented to : Patient, Adult Children  Discharge Placement              Patient chooses bed at: [facility] Patient to be transferred to facility by: [facility] Name of family member notified: June Patient and family notified of of transfer: 12/10/19  Discharge Plan and Services In-house Referral: Clinical Social Work Discharge Planning Services: CM Consult Post Acute Care Choice: [place]                               Social Determinants of Health (SDOH) Interventions     Readmission Risk Interventions Readmission Risk Prevention Plan 12/07/2019  Post Dischage Appt Not Complete  Appt Comments rec for SNF placement  Medication Screening Complete  Transportation Screening Complete  Some recent data might be hidden

## 2019-12-10 NOTE — Progress Notes (Signed)
Pt has been discharged via PTAR. Granddaughter at bedside. All belongings has been sent and all IV has been removed. Pt reports no pain. Pt sent in good spirits and report is being given to oncoming facility.

## 2019-12-10 NOTE — Discharge Summary (Signed)
Physician Discharge Summary  Faith Bolton IEP:[id number] DOB: April 20, 1919 DOA: 12/05/2019  PCP: Prince Solian, MD  Admit date: 12/05/2019 Discharge date: 12/10/2019  Admitted From: Home  Disposition:  SNF   Recommendations for Outpatient Follow-up:  1. Obtain x-ray left ankle in 3 days and fax report to Dr. Stann Mainland 2. Schedule appointment with Orthopedics Dr. Stann Mainland in 1 week 3. Expect patient's to have cast placed in 1 week by Dr. Stann Mainland 4. Schedule appointment with Neurosurgery Weston Brass, NP in 2 weeks  5. Have patient use incentive spirometer 6x per day 6. Please consult Palliative Care to follow at SNF     Home Health: N/A  Equipment/Devices: TBD at SNF  Discharge Condition: Fair  CODE STATUS: DNR Diet recommendation: Regular  Brief/Interim Summary: Faith Bolton is a 86 y.o. F with hyponatremia, HTN who presented after a fall.  Evidently she fell the day prior to admission because her "ankle gave out".  Declined EMS transport that day, and then next day, fell again while walking to the bathroom, struck her head, and this time was transported.  In the ER, found to have a bimalleolar ankle fracture, SDH/SAH, hstrop 122, normal c-spine, and Na 126.      PRINCIPAL HOSPITAL DIAGNOSIS: Subdural hematoma    Discharge Diagnoses:   Subdural hematoma Subarachnoid hemorrhage Patient admitted, aspirin held.  Neurosurgery consulted.  Repeat CT head showed slight increase in size at 24 hours, but remained clinically stable.  Neurosurgery reviewed and felt no intervention necessary.  Repeat CT head second time at 48 hours showed no change from 24 hours.  -Hold aspirin -Repeat CT head in 2-3 weeks with NSG in office      Community acquired pneumonia Patient deteriorated and had mental status changes on hospital day 2 after a dose of oxycodone.  She developed fever shortly after, so blood cultures were collected that were sterile.    CXR and Urine culture  was deferred due to family/patient request at the patients' age 86, after discussion of risks benefits, and the patient was placed on empiric ceftriaxone for suspected pneumonia.    She improved on empiric ceftriaxone.  Now mentating at baseline, taking orals.  Temp < 100 F, heart rate < 100bpm, RR < 24, stable for discharge with 4 more days cefdinir. -Continue cefdinir to complete 7 days -Important to use incentive spirometer    Acute metabolic encephalopathy Resolved  Ankle fracture Plans for nonoperative management, no weightbearing  -Xray in 1 week to ensure no displacement, per surgery: If this displaces that would increase her risk for wound complications and potentially skin necrosis, and at that point would need to consider surgery. -Nonweightbearing in a short leg splint for 6 weeks total. -Transition her into a cast at 2 weeks.    Hyponatremia Chronic, asymptomatic, no change  Hypertension Continue Avapro           Discharge Instructions   Allergies as of 12/10/2019      Reactions   Ciprofloxacin Other (See Comments)   Pt does not remember    Clindamycin/lincomycin Diarrhea   Diphenoxylate-atropine    Metronidazole       Medication List    STOP taking these medications   aspirin 81 MG tablet   traMADol 50 MG tablet Commonly known as: ULTRAM     TAKE these medications   acetaminophen 325 MG tablet Commonly known as: TYLENOL Take 650 mg by mouth every 6 (six) hours as needed for mild pain.   Benicar 20 MG tablet  Generic drug: olmesartan Take 20 mg by mouth daily.   CALCIUM + D + K PO Take 1 tablet by mouth daily.   cefdinir 300 MG capsule Commonly known as: OMNICEF Take 1 capsule (300 mg total) by mouth 2 (two) times daily for 4 days.   CENTRUM SILVER ADULT 50+ PO Take 1 tablet by mouth daily.   docusate sodium 100 MG capsule Commonly known as: COLACE Take 100 mg by mouth daily as needed for mild constipation.    glucosamine-chondroitin 500-400 MG tablet Take 1 tablet by mouth daily.   Influenza vac split quadrivalent PF 0.5 ML injection Commonly known as: FLUZONE HIGH-DOSE Fluzone High-Dose 2018-2019 (PF) 180 mcg/0.5 mL intramuscular syringe  TO BE ADMINISTERED BY PHARMACIST FOR IMMUNIZATION   loratadine 10 MG tablet Commonly known as: CLARITIN Take 10 mg by mouth daily as needed for allergies.   omeprazole 20 MG capsule Commonly known as: PRILOSEC Take 20 mg by mouth daily.   propranolol 10 MG tablet Commonly known as: INDERAL Take 10 mg by mouth See admin instructions. Taking 1 (10mg )  tablet two or three times daily   Systane Ultra 0.4-0.3 % Soln Generic drug: Polyethyl Glycol-Propyl Glycol Place 1 drop into both eyes 2 (two) times daily as needed (dry eyes).       Follow-up Information    Nicholes Stairs, MD. Schedule an appointment as soon as possible for a visit in 1 week(s).   Specialty: Orthopedic Surgery Contact information: [address] [place] [address] [phone]        Marvis Moeller, NP. Schedule an appointment as soon as possible for a visit in 2 week(s).   Specialty: Neurosurgery Contact information: [address] [place] [address] [phone]              Allergies  Allergen Reactions  . Ciprofloxacin Other (See Comments)    Pt does not remember   . Clindamycin/Lincomycin Diarrhea  . Diphenoxylate-Atropine   . Metronidazole     Consultations:  Orthopedics  Neurosurgery   Procedures/Studies: DG Chest 2 View  Result Date: 12/05/2019 CLINICAL DATA:  86 year old post fall walking to the bathroom today. EXAM: CHEST - 2 VIEW COMPARISON:  Remote radiograph 01/28/2007 FINDINGS: Lung volumes are low. Upper normal heart size. Mild aortic tortuosity. Aortic atherosclerosis. Subsegmental opacities in the lung bases typical of atelectasis. Possible small pleural effusions. No pneumothorax or confluent  airspace disease. No acute osseous abnormalities are seen. Scoliotic curvature of the upper lumbar spine. IMPRESSION: Low lung volumes with bibasilar atelectasis and possible small pleural effusions. Aortic Atherosclerosis (ICD10-I70.0). Electronically Signed   By: Keith Rake M.D.   On: 12/05/2019 19:40   DG Pelvis 1-2 Views  Result Date: 12/05/2019 CLINICAL DATA:  [place] while going to bathroom EXAM: PELVIS - 1-2 VIEW COMPARISON:  None. FINDINGS: SI joints are non widened. Pubic symphysis and rami appear intact. No fracture or malalignment. Mild to moderate degenerative change of the hips. IMPRESSION: No acute osseous abnormality. Electronically Signed   By: Donavan Foil M.D.   On: 12/05/2019 19:39   DG Ankle Complete Left  Result Date: 12/05/2019 CLINICAL DATA:  Fall.  Left foot and ankle pain.  Ankle swelling. EXAM: LEFT ANKLE COMPLETE - 3+ VIEW COMPARISON:  None. FINDINGS: Three views study shows an oblique fracture of the distal fibula, proximal to the tibiotalar joint. Associated fracture of the medial malleolus evident. Lateral film shows no definite posterior lip fracture of the distal tibia. No  evidence for subluxation. Bones are diffusely demineralized. Overlying soft tissue swelling evident. IMPRESSION: Bimalleolar ankle fracture without gross subluxation. Electronically Signed   By: Misty Stanley M.D.   On: 12/05/2019 19:40   CT HEAD WO CONTRAST  Result Date: 12/07/2019 CLINICAL DATA:  Follow-up subarachnoid hemorrhage EXAM: CT HEAD WITHOUT CONTRAST TECHNIQUE: Contiguous axial images were obtained from the base of the skull through the vertex without intravenous contrast. COMPARISON:  12/06/2019 FINDINGS: Brain: Unchanged subdural hematoma about the left hemispheric vertex measuring up to 7 mm in thickness (series 5, image 42). Unchanged minimal subdural hemorrhage about the left aspect of the falx (series 4, image 22). Unchanged foci of subarachnoid hemorrhage within the sulci about the  left parietal vertex (series 4, image 24). No significant mass effect midline shift. Periventricular and deep white matter hypodensity with mild global volume loss. No evidence of acute infarction or hydrocephalus. Vascular: No hyperdense vessel or unexpected calcification. Skull: Normal. Negative for fracture or focal lesion. Sinuses/Orbits: Total opacification of the partially imaged right maxillary sinus with a calcified lesion at the antrum (series 3, image 5). Other: None. IMPRESSION: 1. Unchanged subdural hematoma about the left hemispheric vertex measuring up to 7 mm in thickness. Unchanged minimal subdural hemorrhage about the left aspect of the falx. 2. Unchanged foci of subarachnoid hemorrhage within the sulci about the left parietal vertex. 3. No significant mass effect or midline shift. 4. Small-vessel white matter disease and mild global volume loss in keeping with advanced patient age. 5. Total opacification of the partially imaged right maxillary sinus with a calcified lesion at the antrum. Electronically Signed   By: Eddie Candle M.D.   On: 12/07/2019 14:47   CT HEAD WO CONTRAST  Result Date: 12/06/2019 CLINICAL DATA:  Follow-up subarachnoid hemorrhage EXAM: CT HEAD WITHOUT CONTRAST TECHNIQUE: Contiguous axial images were obtained from the base of the skull through the vertex without intravenous contrast. COMPARISON:  Yesterday FINDINGS: Brain: Subdural hematoma along the left frontal parietal convexity with mild increase from before. Maximal thickness is measured in the parietal region at 7 mm. Interhemispheric subdural continuation is now seen. There is patchy subarachnoid hemorrhage greatest at the left vertex, mildly increased from before. No brain swelling or infarct seen. No hydrocephalus. Generalized brain atrophy Vascular: Atherosclerotic calcification Skull: No acute finding Sinuses/Orbits: Calcified mass at the right nasal cavity and medial right maxillary sinus with chronic right  maxillary sinusitis. IMPRESSION: 1. Left convexity subdural hematoma with increase from yesterday, now up to 7 mm in thickness along the parietal lobe. 2. Mild increase in patchy subarachnoid hemorrhage at the left vertex. 3. No midline shift. 4. Chronic calcified mass obstructing the right maxillary sinus. Electronically Signed   By: Bolton Fantasia M.D.   On: 12/06/2019 07:44   CT Head Wo Contrast  Result Date: 12/05/2019 CLINICAL DATA:  Fall EXAM: CT HEAD WITHOUT CONTRAST CT CERVICAL SPINE WITHOUT CONTRAST TECHNIQUE: Multidetector CT imaging of the head and cervical spine was performed following the standard protocol without intravenous contrast. Multiplanar CT image reconstructions of the cervical spine were also generated. COMPARISON:  None. FINDINGS: CT HEAD FINDINGS Brain: There is mixed subdural and subarachnoid hemorrhage over the posterior left hemisphere. There is no associated mass effect. Subdural component measures 6 mm. There is generalized atrophy without lobar predilection. There is hypoattenuation of the periventricular white matter, most commonly indicating chronic ischemic microangiopathy. Vascular: No abnormal hyperdensity of the major intracranial arteries or dural venous sinuses. No intracranial atherosclerosis. Skull: The visualized skull base, calvarium  and extracranial soft tissues are normal. Sinuses/Orbits: Chronic right maxillary sinusitis with possible antrochoanal polyp. The orbits are normal. CT CERVICAL SPINE FINDINGS Alignment: No static subluxation. Facets are aligned. Occipital condyles are normally positioned. Skull base and vertebrae: No acute fracture. Soft tissues and spinal canal: No prevertebral fluid or swelling. No visible canal hematoma. Disc levels: Multilevel degenerative disc disease and facet arthrosis. No spinal canal stenosis. There is severe left C7 foraminal stenosis. Upper chest: Right pleural effusion.  4 mm left apical nodule. Other: Calcific aortic  atherosclerosis. IMPRESSION: 1. Mixed subdural and subarachnoid hemorrhage over the posterior left hemisphere without associated mass effect. 2. No acute fracture or static subluxation of the cervical spine. 3. Right pleural effusion. Aortic Atherosclerosis (ICD10-I70.0). Critical Value/emergent results were called by telephone at the time of interpretation on 12/05/2019 at 7:42 pm to provider JULIE HAVILAND , who verbally acknowledged these results. Electronically Signed   By: Ulyses Jarred M.D.   On: 12/05/2019 19:43   CT Cervical Spine Wo Contrast  Result Date: 12/05/2019 CLINICAL DATA:  Fall EXAM: CT HEAD WITHOUT CONTRAST CT CERVICAL SPINE WITHOUT CONTRAST TECHNIQUE: Multidetector CT imaging of the head and cervical spine was performed following the standard protocol without intravenous contrast. Multiplanar CT image reconstructions of the cervical spine were also generated. COMPARISON:  None. FINDINGS: CT HEAD FINDINGS Brain: There is mixed subdural and subarachnoid hemorrhage over the posterior left hemisphere. There is no associated mass effect. Subdural component measures 6 mm. There is generalized atrophy without lobar predilection. There is hypoattenuation of the periventricular white matter, most commonly indicating chronic ischemic microangiopathy. Vascular: No abnormal hyperdensity of the major intracranial arteries or dural venous sinuses. No intracranial atherosclerosis. Skull: The visualized skull base, calvarium and extracranial soft tissues are normal. Sinuses/Orbits: Chronic right maxillary sinusitis with possible antrochoanal polyp. The orbits are normal. CT CERVICAL SPINE FINDINGS Alignment: No static subluxation. Facets are aligned. Occipital condyles are normally positioned. Skull base and vertebrae: No acute fracture. Soft tissues and spinal canal: No prevertebral fluid or swelling. No visible canal hematoma. Disc levels: Multilevel degenerative disc disease and facet arthrosis. No spinal  canal stenosis. There is severe left C7 foraminal stenosis. Upper chest: Right pleural effusion.  4 mm left apical nodule. Other: Calcific aortic atherosclerosis. IMPRESSION: 1. Mixed subdural and subarachnoid hemorrhage over the posterior left hemisphere without associated mass effect. 2. No acute fracture or static subluxation of the cervical spine. 3. Right pleural effusion. Aortic Atherosclerosis (ICD10-I70.0). Critical Value/emergent results were called by telephone at the time of interpretation on 12/05/2019 at 7:42 pm to provider JULIE HAVILAND , who verbally acknowledged these results. Electronically Signed   By: Ulyses Jarred M.D.   On: 12/05/2019 19:43   ECHOCARDIOGRAM COMPLETE  Result Date: 12/06/2019    ECHOCARDIOGRAM REPORT   Patient Name:   Marcee TAYJAH LOBDELL Date of Exam: 12/06/2019 Medical Rec #:  [id number]       Height:       64.0 in Accession #:    [id number]      Weight:       165.0 lb Date of Birth:  12/23/1919       BSA:          1.803 m Patient Age:    22 years        BP:           159/66 mmHg Patient Gender: F               HR:  87 bpm. Exam Location:  Inpatient Procedure: 2D Echo, Cardiac Doppler and Color Doppler Indications:    Elevated troponin  History:        Patient has no prior history of Echocardiogram examinations.                 Risk Factors:Hypertension.  Sonographer:    Clayton Lefort RDCS (AE) Referring Phys: [id number] Suann Larry [facility]  Sonographer Comments: Suboptimal subcostal window and Technically challenging study due to limited acoustic windows. Unable to roll patient on left side due to broken ankle. IMPRESSIONS  1. Left ventricular ejection fraction, by estimation, is 60 to 65%. The left ventricle has normal function. The left ventricle has no regional wall motion abnormalities. There is severe left ventricular hypertrophy. Left ventricular diastolic parameters  are consistent with Grade I diastolic dysfunction (impaired relaxation). Elevated left atrial pressure.   2. Right ventricular systolic function is normal. The right ventricular size is normal. There is mildly elevated pulmonary artery systolic pressure.  3. The mitral valve is normal in structure. Trivial mitral valve regurgitation. No evidence of mitral stenosis.  4. The aortic valve is tricuspid. Aortic valve regurgitation is not visualized. No aortic stenosis is present.  5. The inferior vena cava is normal in size with greater than 50% respiratory variability, suggesting right atrial pressure of 3 mmHg. FINDINGS  Left Ventricle: Left ventricular ejection fraction, by estimation, is 60 to 65%. The left ventricle has normal function. The left ventricle has no regional wall motion abnormalities. The left ventricular internal cavity size was normal in size. There is  severe left ventricular hypertrophy. Left ventricular diastolic parameters are consistent with Grade I diastolic dysfunction (impaired relaxation). Elevated left atrial pressure. Right Ventricle: The right ventricular size is normal.Right ventricular systolic function is normal. There is mildly elevated pulmonary artery systolic pressure. The tricuspid regurgitant velocity is 2.96 m/s, and with an assumed right atrial pressure of  3 mmHg, the estimated right ventricular systolic pressure is 83.3 mmHg. Left Atrium: Left atrial size was normal in size. Right Atrium: Right atrial size was normal in size. Pericardium: There is no evidence of pericardial effusion. Mitral Valve: The mitral valve is normal in structure. Mild mitral annular calcification. Trivial mitral valve regurgitation. No evidence of mitral valve stenosis. MV peak gradient, 10.5 mmHg. The mean mitral valve gradient is 3.0 mmHg. Tricuspid Valve: The tricuspid valve is normal in structure. Tricuspid valve regurgitation is trivial. No evidence of tricuspid stenosis. Aortic Valve: The aortic valve is tricuspid. Aortic valve regurgitation is not visualized. No aortic stenosis is present. Aortic  valve mean gradient measures 4.0 mmHg. Aortic valve peak gradient measures 8.0 mmHg. Aortic valve area, by VTI measures 2.52 cm. Pulmonic Valve: The pulmonic valve was not well visualized. Pulmonic valve regurgitation is not visualized. No evidence of pulmonic stenosis. Aorta: The aortic root is normal in size and structure. Venous: The inferior vena cava is normal in size with greater than 50% respiratory variability, suggesting right atrial pressure of 3 mmHg. IAS/Shunts: No atrial level shunt detected by color flow Doppler.  LEFT VENTRICLE PLAX 2D LVIDd:         2.90 cm  Diastology LVIDs:         2.30 cm  LV e' medial:    3.26 cm/s LV PW:         1.60 cm  LV E/e' medial:  22.1 LV IVS:        1.90 cm  LV e' lateral:   4.13 cm/s  LVOT diam:     1.90 cm  LV E/e' lateral: 17.4 LV SV:         58 LV SV Index:   32 LVOT Area:     2.84 cm  RIGHT VENTRICLE             IVC RV S prime:     14.00 cm/s  IVC diam: 1.40 cm TAPSE (M-mode): 1.5 cm LEFT ATRIUM             Index       RIGHT ATRIUM           Index LA diam:        2.10 cm 1.16 cm/m  RA Area:     12.30 cm LA Vol (A2C):   17.2 ml 9.54 ml/m  RA Volume:   25.10 ml  13.92 ml/m LA Vol (A4C):   28.1 ml 15.59 ml/m LA Biplane Vol: 22.8 ml 12.65 ml/m  AORTIC VALVE AV Area (Vmax):    2.57 cm AV Area (Vmean):   2.54 cm AV Area (VTI):     2.52 cm AV Vmax:           141.00 cm/s AV Vmean:          96.600 cm/s AV VTI:            0.231 m AV Peak Grad:      8.0 mmHg AV Mean Grad:      4.0 mmHg LVOT Vmax:         128.00 cm/s LVOT Vmean:        86.400 cm/s LVOT VTI:          0.205 m LVOT/AV VTI ratio: 0.89  AORTA Ao Root diam: 3.50 cm Ao Asc diam:  3.60 cm MITRAL VALVE                TRICUSPID VALVE MV Area (PHT): 2.17 cm     TR Peak grad:   35.0 mmHg MV Peak grad:  10.5 mmHg    TR Vmax:        296.00 cm/s MV Mean grad:  3.0 mmHg MV Vmax:       1.62 m/s     SHUNTS MV Vmean:      84.3 cm/s    Systemic VTI:  0.20 m MV Decel Time: 349 msec     Systemic Diam: 1.90 cm MV E  velocity: 72.00 cm/s MV A velocity: 125.00 cm/s MV E/A ratio:  0.58 Kirk Ruths MD Electronically signed by Kirk Ruths MD Signature Date/Time: 12/06/2019/2:45:54 PM    Final       Subjective: Feeling tired all over.  No fever, no confusion.  Ankle pain tolerable.  No cough, urinary symptoms.  Discharge Exam: Vitals:   12/10/19 0825 12/10/19 1001  BP: (!) 148/71 111/61  Pulse: 97 92  Resp: 17   Temp: 97.9 F (36.6 C)   SpO2: 97%    Vitals:   12/10/19 0500 12/10/19 0729 12/10/19 0825 12/10/19 1001  BP:  130/61 (!) 148/71 111/61  Pulse:  96 97 92  Resp:  18 17   Temp:  98.2 F (36.8 C) 97.9 F (36.6 C)   TempSrc:  Axillary Oral   SpO2:  96% 97%   Weight: 72.3 kg     Height:        General: Pt is alert, awake, not in acute distress, appears tired. Cardiovascular: Slightly tachycardic, nl S1-S2, no murmurs appreciated.   No LE edema.  Respiratory: Normal respiratory rate and rhythm.  CTAB without rales or wheezes. Abdominal: Abdomen soft and non-tender.  No distension or HSM.   Neuro/Psych: Strength symmetric in upper and lower extremities.  Judgment and insight appear normal.   The results of significant diagnostics from this hospitalization (including imaging, microbiology, ancillary and laboratory) are listed below for reference.     Microbiology: Recent Results (from the past 240 hour(s))  Respiratory Panel by RT PCR (Flu A&B, Covid) - Nasopharyngeal Swab     Status: None   Collection Time: 12/05/19  8:16 PM   Specimen: Nasopharyngeal Swab  Result Value Ref Range Status   SARS Coronavirus 2 by RT PCR NEGATIVE NEGATIVE Final    Comment: (NOTE) SARS-CoV-2 target nucleic acids are NOT DETECTED.  The SARS-CoV-2 RNA is generally detectable in upper respiratoy specimens during the acute phase of infection. The lowest concentration of SARS-CoV-2 viral copies this assay can detect is 131 copies/mL. A negative result does not preclude SARS-Cov-2 infection and  should not be used as the sole basis for treatment or other patient management decisions. A negative result may occur with  improper specimen collection/handling, submission of specimen other than nasopharyngeal swab, presence of viral mutation(s) within the areas targeted by this assay, and inadequate number of viral copies (<131 copies/mL). A negative result must be combined with clinical observations, patient history, and epidemiological information. The expected result is Negative.  Fact Sheet for Patients:  PinkCheek.be  Fact Sheet for Healthcare Providers:  GravelBags.it  This test is no t yet approved or cleared by the [place] FDA and  has been authorized for detection and/or diagnosis of SARS-CoV-2 by FDA under an Emergency Use Authorization (EUA). This EUA will remain  in effect (meaning this test can be used) for the duration of the COVID-19 declaration under Section 564(b)(1) of the Act, 21 U.S.C. section 360bbb-3(b)(1), unless the authorization is terminated or revoked sooner.     Influenza A by PCR NEGATIVE NEGATIVE Final   Influenza B by PCR NEGATIVE NEGATIVE Final    Comment: (NOTE) The Xpert Xpress SARS-CoV-2/FLU/RSV assay is intended as an aid in  the diagnosis of influenza from Nasopharyngeal swab specimens and  should not be used as a sole basis for treatment. Nasal washings and  aspirates are unacceptable for Xpert Xpress SARS-CoV-2/FLU/RSV  testing.  Fact Sheet for Patients: PinkCheek.be  Fact Sheet for Healthcare Providers: GravelBags.it  This test is not yet approved or cleared by the [place] FDA and  has been authorized for detection and/or diagnosis of SARS-CoV-2 by  FDA under an Emergency Use Authorization (EUA). This EUA will remain  in effect (meaning this test can be used) for the duration of the  Covid-19 declaration  under Section 564(b)(1) of the Act, 21  U.S.C. section 360bbb-3(b)(1), unless the authorization is  terminated or revoked. Performed at [facility], [place] [address] [place], [place] [address]   Culture, blood (routine x 2)     Status: None (Preliminary result)   Collection Time: 12/07/19  4:32 PM   Specimen: BLOOD  Result Value Ref Range Status   Specimen Description BLOOD LEFT ANTECUBITAL  Final   Special Requests   Final    BOTTLES DRAWN AEROBIC AND ANAEROBIC Blood Culture results may not be optimal due to an inadequate volume of blood received in culture bottles   Culture   Final    NO GROWTH 3 DAYS Performed at [facility], [place] [address], [place], [place]  [address]    Report Status PENDING  Incomplete  Culture, blood (routine x 2)     Status: None (Preliminary result)   Collection Time: 12/07/19  4:32 PM   Specimen: BLOOD  Result Value Ref Range Status   Specimen Description BLOOD LEFT ANTECUBITAL  Final   Special Requests   Final    BOTTLES DRAWN AEROBIC ONLY Blood Culture results may not be optimal due to an inadequate volume of blood received in culture bottles   Culture   Final    NO GROWTH 3 DAYS Performed at [facility], [place] [address], [place], [place] [address]    Report Status PENDING  Incomplete  Respiratory Panel by RT PCR (Flu A&B, Covid) - Nasopharyngeal Swab     Status: None   Collection Time: 12/10/19 10:00 AM   Specimen: Nasopharyngeal Swab  Result Value Ref Range Status   SARS Coronavirus 2 by RT PCR NEGATIVE NEGATIVE Final    Comment: (NOTE) SARS-CoV-2 target nucleic acids are NOT DETECTED.  The SARS-CoV-2 RNA is generally detectable in upper respiratoy specimens during the acute phase of infection. The lowest concentration of SARS-CoV-2 viral copies this assay can detect is 131 copies/mL. A negative result does not preclude SARS-Cov-2 infection and should not be used as the sole basis for treatment or other patient management  decisions. A negative result may occur with  improper specimen collection/handling, submission of specimen other than nasopharyngeal swab, presence of viral mutation(s) within the areas targeted by this assay, and inadequate number of viral copies (<131 copies/mL). A negative result must be combined with clinical observations, patient history, and epidemiological information. The expected result is Negative.  Fact Sheet for Patients:  PinkCheek.be  Fact Sheet for Healthcare Providers:  GravelBags.it  This test is no t yet approved or cleared by the [place] FDA and  has been authorized for detection and/or diagnosis of SARS-CoV-2 by FDA under an Emergency Use Authorization (EUA). This EUA will remain  in effect (meaning this test can be used) for the duration of the COVID-19 declaration under Section 564(b)(1) of the Act, 21 U.S.C. section 360bbb-3(b)(1), unless the authorization is terminated or revoked sooner.     Influenza A by PCR NEGATIVE NEGATIVE Final   Influenza B by PCR NEGATIVE NEGATIVE Final    Comment: (NOTE) The Xpert Xpress SARS-CoV-2/FLU/RSV assay is intended as an aid in  the diagnosis of influenza from Nasopharyngeal swab specimens and  should not be used as a sole basis for treatment. Nasal washings and  aspirates are unacceptable for Xpert Xpress SARS-CoV-2/FLU/RSV  testing.  Fact Sheet for Patients: PinkCheek.be  Fact Sheet for Healthcare Providers: GravelBags.it  This test is not yet approved or cleared by the [place] FDA and  has been authorized for detection and/or diagnosis of SARS-CoV-2 by  FDA under an Emergency Use Authorization (EUA). This EUA will remain  in effect (meaning this test can be used) for the duration of the  Covid-19 declaration under Section 564(b)(1) of the Act, 21  U.S.C. section 360bbb-3(b)(1), unless the  authorization is  terminated or revoked. Performed at [facility], [place] [address], [place], [place] [address]      Labs: BNP (last 3 results) No results for input(s): BNP in the last 8760 hours. Basic Metabolic Panel: Recent Labs  Lab 12/05/19 1815 12/06/19 0435  NA 126* 126*  K 4.7 3.9  CL 89* 91*  CO2 24 22  GLUCOSE 169* 150*  BUN 9 6*  CREATININE 0.81 0.74  CALCIUM 10.0 9.2   Liver Function Tests: Recent Labs  Lab 12/05/19 1815  AST 42*  ALT 17  ALKPHOS 95  BILITOT 1.0  PROT 6.9  ALBUMIN 3.6   No results for input(s): LIPASE, AMYLASE in the last 168 hours. No results for input(s): AMMONIA in the last 168 hours. CBC: Recent Labs  Lab 12/05/19 1815 12/06/19 0435  WBC 9.1 7.9  NEUTROABS 7.1  --   HGB 13.2 11.9*  HCT 38.7 34.1*  MCV 90.2 90.7  PLT 282 242   Cardiac Enzymes: No results for input(s): CKTOTAL, CKMB, CKMBINDEX, TROPONINI in the last 168 hours. BNP: Invalid input(s): POCBNP CBG: No results for input(s): GLUCAP in the last 168 hours. D-Dimer No results for input(s): DDIMER in the last 72 hours. Hgb A1c No results for input(s): HGBA1C in the last 72 hours. Lipid Profile No results for input(s): CHOL, HDL, LDLCALC, TRIG, CHOLHDL, LDLDIRECT in the last 72 hours. Thyroid function studies No results for input(s): TSH, T4TOTAL, T3FREE, THYROIDAB in the last 72 hours.  Invalid input(s): FREET3 Anemia work up No results for input(s): VITAMINB12, FOLATE, FERRITIN, TIBC, IRON, RETICCTPCT in the last 72 hours. Urinalysis    Component Value Date/Time   COLORURINE YELLOW 12/05/2019 1925   APPEARANCEUR HAZY (A) 12/05/2019 1925   LABSPEC 1.009 12/05/2019 1925   PHURINE 7.0 12/05/2019 1925   GLUCOSEU NEGATIVE 12/05/2019 1925   HGBUR NEGATIVE 12/05/2019 1925   BILIRUBINUR NEGATIVE 12/05/2019 1925   KETONESUR NEGATIVE 12/05/2019 1925   PROTEINUR 30 (A) 12/05/2019 1925   UROBILINOGEN 0.2 01/28/2007 1741   NITRITE NEGATIVE 12/05/2019 1925    LEUKOCYTESUR NEGATIVE 12/05/2019 1925   Sepsis Labs Invalid input(s): PROCALCITONIN,  WBC,  LACTICIDVEN Microbiology Recent Results (from the past 240 hour(s))  Respiratory Panel by RT PCR (Flu A&B, Covid) - Nasopharyngeal Swab     Status: None   Collection Time: 12/05/19  8:16 PM   Specimen: Nasopharyngeal Swab  Result Value Ref Range Status   SARS Coronavirus 2 by RT PCR NEGATIVE NEGATIVE Final    Comment: (NOTE) SARS-CoV-2 target nucleic acids are NOT DETECTED.  The SARS-CoV-2 RNA is generally detectable in upper respiratoy specimens during the acute phase of infection. The lowest concentration of SARS-CoV-2 viral copies this assay can detect is 131 copies/mL. A negative result does not preclude SARS-Cov-2 infection and should not be used as the sole basis for treatment or other patient management decisions. A negative result may occur with  improper specimen collection/handling, submission of specimen other than nasopharyngeal swab, presence of viral mutation(s) within the areas targeted by this assay, and inadequate number of viral copies (<131 copies/mL). A negative result must be combined with clinical observations, patient history, and epidemiological information. The expected result is Negative.  Fact Sheet for Patients:  PinkCheek.be  Fact Sheet for Healthcare Providers:  GravelBags.it  This test is no t yet approved or cleared by the [place] FDA and  has been authorized for detection and/or diagnosis of SARS-CoV-2 by FDA under an Emergency Use Authorization (EUA). This EUA will remain  in effect (meaning this test can be used) for the duration of the COVID-19 declaration under Section 564(b)(1) of the Act, 21 U.S.C. section 360bbb-3(b)(1), unless the authorization is terminated or revoked sooner.     Influenza A by PCR NEGATIVE NEGATIVE Final   Influenza B by PCR NEGATIVE NEGATIVE Final     Comment: (NOTE) The Xpert Xpress SARS-CoV-2/FLU/RSV assay is intended as an aid in  the diagnosis of influenza from Nasopharyngeal swab specimens and  should not be used as a sole basis for treatment. Nasal washings and  aspirates are unacceptable for Xpert Xpress SARS-CoV-2/FLU/RSV  testing.  Fact Sheet for Patients: PinkCheek.be  Fact Sheet for Healthcare Providers: GravelBags.it  This test is not yet approved or cleared by the [place] FDA and  has been authorized for detection and/or diagnosis of SARS-CoV-2 by  FDA under an Emergency Use Authorization (EUA). This EUA will remain  in effect (meaning this test can be used) for the duration of the  Covid-19 declaration under Section 564(b)(1) of the Act, 21  U.S.C. section 360bbb-3(b)(1), unless the authorization is  terminated or revoked. Performed at [facility], [place] [address], [place], [place] [address]   Culture, blood (routine x 2)     Status: None (Preliminary result)   Collection Time: 12/07/19  4:32 PM   Specimen: BLOOD  Result Value Ref Range Status   Specimen Description BLOOD LEFT ANTECUBITAL  Final   Special Requests   Final    BOTTLES DRAWN AEROBIC AND ANAEROBIC Blood Culture results may not be optimal due to an inadequate volume of blood received in culture bottles   Culture   Final    NO GROWTH 3 DAYS Performed at [facility], [place] [address], [place], [place] [address]    Report Status PENDING  Incomplete  Culture, blood (routine x 2)     Status: None (Preliminary result)   Collection Time: 12/07/19  4:32 PM   Specimen: BLOOD  Result Value Ref Range Status   Specimen Description BLOOD LEFT ANTECUBITAL  Final   Special Requests   Final    BOTTLES DRAWN AEROBIC ONLY Blood Culture results may not be optimal due to an inadequate volume of blood received in culture bottles   Culture   Final    NO GROWTH 3 DAYS Performed at [facility], [place] [address], [place],  [address]    Report Status PENDING  Incomplete  Respiratory Panel by RT PCR (Flu A&B, Covid) - Nasopharyngeal Swab     Status: None   Collection Time: 12/10/19 10:00 AM   Specimen: Nasopharyngeal Swab  Result Value Ref Range Status   SARS Coronavirus 2 by RT PCR NEGATIVE NEGATIVE Final    Comment: (NOTE) SARS-CoV-2 target nucleic acids are NOT DETECTED.  The SARS-CoV-2 RNA is generally detectable in upper respiratoy specimens during the acute phase of infection. The lowest concentration of SARS-CoV-2 viral copies this assay can detect is 131 copies/mL. A negative result does not preclude SARS-Cov-2 infection and should not be used as the sole basis for treatment or other patient management decisions. A negative result may occur with  improper specimen collection/handling, submission of specimen other than nasopharyngeal swab, presence of viral mutation(s) within the areas targeted by this assay, and inadequate number of viral copies (<131 copies/mL). A negative result must be combined with clinical observations, patient history, and epidemiological information. The expected result is Negative.  Fact Sheet for Patients:  PinkCheek.be  Fact Sheet for Healthcare Providers:  GravelBags.it  This test is no t yet approved or cleared by the [place] FDA and  has been authorized for detection and/or diagnosis of SARS-CoV-2 by FDA under an Emergency Use Authorization (EUA). This EUA will remain  in effect (meaning this test can be used) for the duration of the COVID-19 declaration under Section 564(b)(1) of the Act, 21 U.S.C. section 360bbb-3(b)(1), unless the  authorization is terminated or revoked sooner.     Influenza A by PCR NEGATIVE NEGATIVE Final   Influenza B by PCR NEGATIVE NEGATIVE Final    Comment: (NOTE) The Xpert Xpress SARS-CoV-2/FLU/RSV assay is intended as an aid in   the diagnosis of influenza from Nasopharyngeal swab specimens and  should not be used as a sole basis for treatment. Nasal washings and  aspirates are unacceptable for Xpert Xpress SARS-CoV-2/FLU/RSV  testing.  Fact Sheet for Patients: PinkCheek.be  Fact Sheet for Healthcare Providers: GravelBags.it  This test is not yet approved or cleared by the [place] FDA and  has been authorized for detection and/or diagnosis of SARS-CoV-2 by  FDA under an Emergency Use Authorization (EUA). This EUA will remain  in effect (meaning this test can be used) for the duration of the  Covid-19 declaration under Section 564(b)(1) of the Act, 21  U.S.C. section 360bbb-3(b)(1), unless the authorization is  terminated or revoked. Performed at [facility], [place] [address], [place],  [address]      Time coordinating discharge: 25 minutes      SIGNED:   Edwin Dada, MD  [facility] 12/10/2019, 11:52 AM

## 2019-12-10 NOTE — Progress Notes (Signed)
Subjective: Patient reports that she is doing well overall but reported " I didn't sleep well last night." She is eager to be discharged and stated "I'm too old for all of this." No acute events were reported overnight.   Objective: Vital signs in last 24 hours: Temp:  [97.5 F (36.4 C)-98.9 F (37.2 C)] 97.9 F (36.6 C) (10/05 0825) Pulse Rate:  [85-97] 97 (10/05 0825) Resp:  [16-18] 17 (10/05 0825) BP: (94-148)/(53-78) 148/71 (10/05 0825) SpO2:  [94 %-99 %] 97 % (10/05 0825) Weight:  [72.3 kg] 72.3 kg (10/05 0500)  Intake/Output from previous day: 10/04 0701 - 10/05 0700 In: 300 [P.O.:300] Out: 900 [Urine:900] Intake/Output this shift: No intake/output data recorded.  Physical Exam Constitutional:  General: She is not in acute distress. Conversant Appearance: Normal appearance.  Eyes:  Extraocular Movements: Extraocular movements intact.  Pupils: Pupils are equal, round, and reactive to light.  Neurological:  General: No focal deficitpresent.  Mental Status: She is alertand oriented to person, place, and time. Mental status isat baseline.  Cranial Nerves: No cranial nerve deficit.  Sensory: No sensory deficit.  Motor: No weakness.  Coordination: Coordination normal.  Deep Tendon Reflexes: Reflexes normal.  Psychiatric:  Mood and Affect: Moodnormal.  Behavior: Behaviornormal.  Thought Content: Thought contentnormal.  Judgment: Judgmentnormal.   Lab Results: No results for input(s): WBC, HGB, HCT, PLT in the last 72 hours. BMET No results for input(s): NA, K, CL, CO2, GLUCOSE, BUN, CREATININE, CALCIUM in the last 72 hours.  Studies/Results: No results found.  Assessment/Plan: Blood cultures with no growth. CXR and urine cultures were deferred due to family/patient request related to the patients' age and after discussion of risks vs. benefits. She is continuing to improve on empiric ceftriaxone. Left  ankle fracture is non-operative with plan to be nonweightbearing for 4 weeks and physical therapy. Continue to hold aspirin. The patient continues to be stable from a neurosurgical perspective. No new neurosurgical input to add at this time. Agree with plan to discharge to SNF once medically stable. Follow up in office with non-contrast head CT in 2- 3 weeks. I appreciate [facility] help in managing this patient. Call with any questions.   LOS: 5 days     Marvis Moeller 12/10/2019, 8:46 AM

## 2019-12-12 LAB — CULTURE, BLOOD (ROUTINE X 2)
Culture: NO GROWTH
Culture: NO GROWTH

## 2019-12-13 ENCOUNTER — Other Ambulatory Visit (HOSPITAL_COMMUNITY): Payer: Self-pay | Admitting: Neurosurgery

## 2019-12-13 ENCOUNTER — Other Ambulatory Visit: Payer: Self-pay | Admitting: Neurosurgery

## 2019-12-13 ENCOUNTER — Encounter: Payer: Self-pay | Admitting: Internal Medicine

## 2019-12-13 ENCOUNTER — Non-Acute Institutional Stay (SKILLED_NURSING_FACILITY): Payer: Medicare Other | Admitting: Internal Medicine

## 2019-12-13 DIAGNOSIS — I10 Essential (primary) hypertension: Secondary | ICD-10-CM

## 2019-12-13 DIAGNOSIS — S065X0A Traumatic subdural hemorrhage without loss of consciousness, initial encounter: Secondary | ICD-10-CM

## 2019-12-13 DIAGNOSIS — H3530 Unspecified macular degeneration: Secondary | ICD-10-CM

## 2019-12-13 DIAGNOSIS — S066X0A Traumatic subarachnoid hemorrhage without loss of consciousness, initial encounter: Secondary | ICD-10-CM | POA: Diagnosis not present

## 2019-12-13 DIAGNOSIS — R627 Adult failure to thrive: Secondary | ICD-10-CM | POA: Insufficient documentation

## 2019-12-13 DIAGNOSIS — E871 Hypo-osmolality and hyponatremia: Secondary | ICD-10-CM | POA: Diagnosis not present

## 2019-12-13 DIAGNOSIS — R778 Other specified abnormalities of plasma proteins: Secondary | ICD-10-CM | POA: Diagnosis not present

## 2019-12-13 DIAGNOSIS — Z66 Do not resuscitate: Secondary | ICD-10-CM | POA: Diagnosis not present

## 2019-12-13 DIAGNOSIS — H5316 Psychophysical visual disturbances: Secondary | ICD-10-CM

## 2019-12-13 DIAGNOSIS — W19XXXA Unspecified fall, initial encounter: Secondary | ICD-10-CM

## 2019-12-13 DIAGNOSIS — J189 Pneumonia, unspecified organism: Secondary | ICD-10-CM

## 2019-12-13 DIAGNOSIS — R7989 Other specified abnormal findings of blood chemistry: Secondary | ICD-10-CM

## 2019-12-13 DIAGNOSIS — S82842A Displaced bimalleolar fracture of left lower leg, initial encounter for closed fracture: Secondary | ICD-10-CM

## 2019-12-13 DIAGNOSIS — Y92009 Unspecified place in unspecified non-institutional (private) residence as the place of occurrence of the external cause: Secondary | ICD-10-CM

## 2019-12-13 MED ORDER — TRAMADOL HCL 50 MG PO TABS: 25.0000 mg | ORAL_TABLET | Freq: Two times a day (BID) | ORAL | 0 refills | Status: AC | PRN

## 2019-12-13 NOTE — Progress Notes (Signed)
Provider:  Rexene Edison. Mariea Clonts, D.O., C.M.D. Location:  [facility] Room Number: [place]:  SNF (31)  PCP: Prince Solian, MD Patient Care Team: Prince Solian, MD as PCP - General (Internal Medicine)  Extended Emergency Contact Information Primary Emergency Contact: Bennett,June Address: [address]          [place], [place] [address] Johnnette Litter of [place] Phone: [phone] Mobile Phone: [phone] Relation: Daughter  Code Status: DNR Goals of Care: Advanced Directive information Advanced Directives 12/13/2019  Does Patient Have a Medical Advance Directive? Yes  Type of Advance Directive Out of facility DNR (pink MOST or yellow form)  Does patient want to make changes to medical advance directive? No - Patient declined  Would patient like information on creating a medical advance directive? -   Chief Complaint  Patient presents with  . New Admit To SNF    New admit for fall and ankle pain     HPI: Patient is a 86 y.o. female seen today for admission to [facility] status post hospitalization from September 30 to October 5 with a fall and by left bimalleolar ankle fracture, subdural hematoma, subarachnoid hemorrhage, hyponatremia, increased troponins and fever.  This 86 year old female has a past medical history significant for macular degeneration with recently worse [name], hypertension, prior cancer of some sort, generalized arthritis and gastroesophageal reflux disease.  She had presented to the emergency department after multiple falls in the past 2 days at home.  She normally was ambulating with use of a walker and first fell on September 29 when her left ankle gave out.  She hit her head hard but did not lose consciousness.  EMS came out to the home however did not want her to want to bring her to the hospital initially.  She fell again the day of her hospital admission when walking to the  bathroom using her walker and her left foot gave out.  She again hit the back of her head this time against the wall.  She did not lose consciousness.  That she was then unable to stand due to significant left ankle pain and swelling so EMS was called and brought her to the ED.  She had been having episodes (3) of nonspecific left-sided chest pain that she felt like twinges that did not radiate.  She has had chronic palpitations with irregular heartbeat and takes propranolol, but has not had atrial fibrillation.  She took aspirin 81 mg daily at home.  In the ED she was mildly hypertensive and tachycardic.  Labs revealed a sodium of 126, glucose of 169, and elevated high-sensitivity troponin of 199.  CT of her head and cervical spine showed mixed subdural and subarachnoid hemorrhage over the posterior left hemisphere without associated mass-effect.  She had no acute fracture of her skull or cervical spine.  Chest x-ray only revealed bibasilar atelectasis.  Pelvic x-rays were negative.  The left ankle x-ray revealed a bimalleolar ankle fracture without gross subluxation.  Neurosurgery was contacted and recommended to repeat CT in the morning.  Home aspirin was held and neurochecks were performed.  Repeat imaging at 24 hours showed slight increase in size of the bleeding areas.  Repeat at 48 hours showed no change.  A repeat in 2 to 3 weeks with neurosurgery in their office was recommended.    Orthopedics recommended splinting and nonweightbearing to her ankle with a.m. follow-up.  Plan is for nonsurgical treatment  and transition to a cast 2 weeks.  She remains nonweightbearing  EKG wound up being unremarkable and cardiac enzymes were cycled.  Her sodium was repleted with IV fluids.  Reportedly she does have some chronic hyponatremia at baseline.  She developed some delirium on hospital day 2 after dose of oxycodone and then subsequently a fever.  Blood cultures were collected which turned out negative.  Chest  x-ray and urine culture were deferred at family request due to her advanced age.  She was placed on empiric ceftriaxone for suspected community-acquired pneumonia.  She did have improvement and was sent here with plans to complete a 7-day course of cefdinir.  Incentive spirometry was recommended.  When I met with the patient at admission she told me that she had lost 20 pounds over a month.  Her intake is poor with little appetite.  She is devastated that she had this fall and went from being fairly independent at home with some assistance from her family to nonweightbearing and mostly stuck in the bed or chair.  She" wants to go".  Her 1 daughter was present in the room and I spoke with her as well as her other daughter by phone during the appointment.  Patient has elected not to take the Covid vaccine primarily because she had not been going out of her home for months and months.  Her CODE STATUS is DO NOT RESUSCITATE.  Unfortunately she has to be in isolation because she is not taking the vaccine.  It appears she was given the flu shot at the hospital.  She did have negative Covid testing on September 30 and October 5.  Past Medical History:  Diagnosis Date  . Cancer ([place])   . Hypertension   . Macular degeneration (senile) of retina, unspecified   . Osteoarthrosis, unspecified whether generalized or localized, unspecified site   . Reflux    Past Surgical History:  Procedure Laterality Date  . APPENDECTOMY    . TONSILLECTOMY      Social History   Socioeconomic History  . Marital status: Widowed    Spouse name: Not on file  . Number of children: Not on file  . Years of education: Not on file  . Highest education level: Not on file  Occupational History  . Not on file  Tobacco Use  . Smoking status: Never Smoker  . Smokeless tobacco: Never Used  Substance and Sexual Activity  . Alcohol use: No  . Drug use: No  . Sexual activity: Not on file  Other Topics Concern  . Not on file   Social History Narrative  . Not on file   Social Determinants of Health   Financial Resource Strain:   . Difficulty of Paying Living Expenses: Not on file  Food Insecurity:   . Worried About Charity fundraiser in the Last Year: Not on file  . Ran Out of Food in the Last Year: Not on file  Transportation Needs:   . Lack of Transportation (Medical): Not on file  . Lack of Transportation (Non-Medical): Not on file  Physical Activity:   . Days of Exercise per Week: Not on file  . Minutes of Exercise per Session: Not on file  Stress:   . Feeling of Stress : Not on file  Social Connections:   . Frequency of Communication with Friends and Family: Not on file  . Frequency of Social Gatherings with Friends and Family: Not on file  . Attends Religious Services: Not on  file  . Active Member of Clubs or Organizations: Not on file  . Attends Archivist Meetings: Not on file  . Marital Status: Not on file    reports that she has never smoked. She has never used smokeless tobacco. She reports that she does not drink alcohol and does not use drugs.  Functional Status Survey:    History reviewed. No pertinent family history.  Health Maintenance  Topic Date Due  . COVID-19 Vaccine (1) Never done  . TETANUS/TDAP  Never done  . DEXA SCAN  Never done  . PNA vac Low Risk Adult (1 of 2 - PCV13) Never done  . INFLUENZA VACCINE  10/06/2019    Allergies  Allergen Reactions  . Ciprofloxacin Other (See Comments)    Pt does not remember   . Clindamycin/Lincomycin Diarrhea  . Diphenoxylate-Atropine   . Metronidazole     Outpatient Encounter Medications as of 12/13/2019  Medication Sig  . acetaminophen (TYLENOL) 325 MG tablet Take 650 mg by mouth every 6 (six) hours as needed for mild pain.   Marland Kitchen BENICAR 20 MG tablet Take 20 mg by mouth daily.   . bisacodyl (BISACODYL LAXATIVE) 10 MG suppository Place 10 mg rectally as needed for moderate constipation.  . Calcium-Vitamin D-Vitamin  K (VIACTIV PO) Take 650 mg by mouth.  . cefdinir (OMNICEF) 300 MG capsule Take 1 capsule (300 mg total) by mouth 2 (two) times daily for 4 days.  Marland Kitchen docusate sodium (COLACE) 100 MG capsule Take 100 mg by mouth daily as needed for mild constipation.  Marland Kitchen glucosamine-chondroitin 500-400 MG tablet Take 1 tablet by mouth daily.   Marland Kitchen loratadine (CLARITIN) 10 MG tablet Take 10 mg by mouth daily as needed for allergies.   . magnesium hydroxide (MILK OF MAGNESIA) 400 MG/5ML suspension Take by mouth daily as needed for mild constipation.  . Multiple Vitamins-Minerals (CENTRUM SILVER ADULT 50+ PO) Take 1 tablet by mouth daily.   Marland Kitchen omeprazole (PRILOSEC) 20 MG capsule Take 20 mg by mouth daily.   Vladimir Faster Glycol-Propyl Glycol (SYSTANE ULTRA) 0.4-0.3 % SOLN Place 1 drop into both eyes 2 (two) times daily as needed (dry eyes).  . propranolol (INDERAL) 10 MG tablet Take 10 mg by mouth See admin instructions. Taking 1 (59m)  tablet two or three times daily  . [DISCONTINUED] Calcium-Vitamin D-Vitamin K (CALCIUM + D + K PO) Take 1 tablet by mouth daily.   . Influenza vac split quadrivalent PF (FLUZONE HIGH-DOSE) 0.5 ML injection Fluzone High-Dose 2018-2019 (PF) 180 mcg/0.5 mL intramuscular syringe  TO BE ADMINISTERED BY PHARMACIST FOR IMMUNIZATION   No facility-administered encounter medications on file as of 12/13/2019.    Review of Systems  Constitutional: Positive for malaise/fatigue and weight loss. Negative for chills and fever.  HENT: Positive for hearing loss. Negative for congestion and sore throat.   Eyes: Positive for blurred vision.       [place]Bonnet (has seen figures walking through her room)  Respiratory: Positive for shortness of breath. Negative for cough.   Cardiovascular: Positive for palpitations. Negative for chest pain and leg swelling.  Gastrointestinal: Negative for abdominal pain, blood in stool, constipation and melena.  Genitourinary: Negative for dysuria.  Musculoskeletal:  Positive for falls and joint pain.  Skin: Negative for itching and rash.  Neurological: Positive for weakness. Negative for dizziness and loss of consciousness.  Psychiatric/Behavioral: Negative for depression and memory loss. The patient is not nervous/anxious and does not have insomnia.     Vitals:  12/13/19 0944  BP: (!) 132/58  Pulse: 68  Temp: (!) 97.4 F (36.3 C)  Weight: 159 lb 6.2 oz (72.3 kg)  Height: 5' 4"  (1.626 m)   Body mass index is 27.36 kg/m. Physical Exam Vitals reviewed.  Constitutional:      General: She is not in acute distress.    Appearance: She is not ill-appearing or toxic-appearing.  HENT:     Head: Normocephalic and atraumatic.     Right Ear: External ear normal.     Left Ear: External ear normal.     Mouth/Throat:     Pharynx: Oropharynx is clear.  Eyes:     Extraocular Movements: Extraocular movements intact.     Pupils: Pupils are equal, round, and reactive to light.  Cardiovascular:     Rate and Rhythm: Normal rate and regular rhythm.     Pulses: Normal pulses.     Heart sounds: Normal heart sounds.  Pulmonary:     Effort: Pulmonary effort is normal.     Breath sounds: No wheezing, rhonchi or rales.  Abdominal:     General: Bowel sounds are normal. There is no distension.     Palpations: Abdomen is soft. There is no mass.     Tenderness: There is no abdominal tenderness. There is no guarding or rebound.  Musculoskeletal:     Cervical back: Neck supple.     Right lower leg: No edema.     Left lower leg: No edema.     Comments: ROM outside of left ankle  Skin:    General: Skin is warm and dry.     Coloration: Skin is pale.     Comments: Left lower leg wrapped in soft cast  Neurological:     General: No focal deficit present.     Mental Status: She is alert and oriented to person, place, and time.     Motor: Weakness present.     Gait: Gait abnormal.  Psychiatric:        Mood and Affect: Mood normal.     Labs reviewed: Basic  Metabolic Panel: Recent Labs    12/05/19 1815 12/06/19 0435  NA 126* 126*  K 4.7 3.9  CL 89* 91*  CO2 24 22  GLUCOSE 169* 150*  BUN 9 6*  CREATININE 0.81 0.74  CALCIUM 10.0 9.2   Liver Function Tests: Recent Labs    12/05/19 1815  AST 42*  ALT 17  ALKPHOS 95  BILITOT 1.0  PROT 6.9  ALBUMIN 3.6   No results for input(s): LIPASE, AMYLASE in the last 8760 hours. No results for input(s): AMMONIA in the last 8760 hours. CBC: Recent Labs    12/05/19 1815 12/06/19 0435  WBC 9.1 7.9  NEUTROABS 7.1  --   HGB 13.2 11.9*  HCT 38.7 34.1*  MCV 90.2 90.7  PLT 282 242   Cardiac Enzymes: No results for input(s): CKTOTAL, CKMB, CKMBINDEX, TROPONINI in the last 8760 hours. BNP: Invalid input(s): POCBNP No results found for: HGBA1C No results found for: TSH No results found for: VITAMINB12 No results found for: FOLATE No results found for: IRON, TIBC, FERRITIN  Imaging and Procedures obtained prior to SNF admission: DG Chest 2 View  Result Date: 12/05/2019 CLINICAL DATA:  86 year old post fall walking to the bathroom today. EXAM: CHEST - 2 VIEW COMPARISON:  Remote radiograph 01/28/2007 FINDINGS: Lung volumes are low. Upper normal heart size. Mild aortic tortuosity. Aortic atherosclerosis. Subsegmental opacities in the lung bases typical of atelectasis. Possible  small pleural effusions. No pneumothorax or confluent airspace disease. No acute osseous abnormalities are seen. Scoliotic curvature of the upper lumbar spine. IMPRESSION: Low lung volumes with bibasilar atelectasis and possible small pleural effusions. Aortic Atherosclerosis (ICD10-I70.0). Electronically Signed   By: Keith Rake M.D.   On: 12/05/2019 19:40   DG Pelvis 1-2 Views  Result Date: 12/05/2019 CLINICAL DATA:  [place] while going to bathroom EXAM: PELVIS - 1-2 VIEW COMPARISON:  None. FINDINGS: SI joints are non widened. Pubic symphysis and rami appear intact. No fracture or malalignment. Mild to moderate  degenerative change of the hips. IMPRESSION: No acute osseous abnormality. Electronically Signed   By: Donavan Foil M.D.   On: 12/05/2019 19:39   DG Ankle Complete Left  Result Date: 12/05/2019 CLINICAL DATA:  Fall.  Left foot and ankle pain.  Ankle swelling. EXAM: LEFT ANKLE COMPLETE - 3+ VIEW COMPARISON:  None. FINDINGS: Three views study shows an oblique fracture of the distal fibula, proximal to the tibiotalar joint. Associated fracture of the medial malleolus evident. Lateral film shows no definite posterior lip fracture of the distal tibia. No evidence for subluxation. Bones are diffusely demineralized. Overlying soft tissue swelling evident. IMPRESSION: Bimalleolar ankle fracture without gross subluxation. Electronically Signed   By: Misty Stanley M.D.   On: 12/05/2019 19:40   CT HEAD WO CONTRAST  Result Date: 12/06/2019 CLINICAL DATA:  Follow-up subarachnoid hemorrhage EXAM: CT HEAD WITHOUT CONTRAST TECHNIQUE: Contiguous axial images were obtained from the base of the skull through the vertex without intravenous contrast. COMPARISON:  Yesterday FINDINGS: Brain: Subdural hematoma along the left frontal parietal convexity with mild increase from before. Maximal thickness is measured in the parietal region at 7 mm. Interhemispheric subdural continuation is now seen. There is patchy subarachnoid hemorrhage greatest at the left vertex, mildly increased from before. No brain swelling or infarct seen. No hydrocephalus. Generalized brain atrophy Vascular: Atherosclerotic calcification Skull: No acute finding Sinuses/Orbits: Calcified mass at the right nasal cavity and medial right maxillary sinus with chronic right maxillary sinusitis. IMPRESSION: 1. Left convexity subdural hematoma with increase from yesterday, now up to 7 mm in thickness along the parietal lobe. 2. Mild increase in patchy subarachnoid hemorrhage at the left vertex. 3. No midline shift. 4. Chronic calcified mass obstructing the right  maxillary sinus. Electronically Signed   By: Monte Fantasia M.D.   On: 12/06/2019 07:44   CT Head Wo Contrast  Result Date: 12/05/2019 CLINICAL DATA:  Fall EXAM: CT HEAD WITHOUT CONTRAST CT CERVICAL SPINE WITHOUT CONTRAST TECHNIQUE: Multidetector CT imaging of the head and cervical spine was performed following the standard protocol without intravenous contrast. Multiplanar CT image reconstructions of the cervical spine were also generated. COMPARISON:  None. FINDINGS: CT HEAD FINDINGS Brain: There is mixed subdural and subarachnoid hemorrhage over the posterior left hemisphere. There is no associated mass effect. Subdural component measures 6 mm. There is generalized atrophy without lobar predilection. There is hypoattenuation of the periventricular white matter, most commonly indicating chronic ischemic microangiopathy. Vascular: No abnormal hyperdensity of the major intracranial arteries or dural venous sinuses. No intracranial atherosclerosis. Skull: The visualized skull base, calvarium and extracranial soft tissues are normal. Sinuses/Orbits: Chronic right maxillary sinusitis with possible antrochoanal polyp. The orbits are normal. CT CERVICAL SPINE FINDINGS Alignment: No static subluxation. Facets are aligned. Occipital condyles are normally positioned. Skull base and vertebrae: No acute fracture. Soft tissues and spinal canal: No prevertebral fluid or swelling. No visible canal hematoma. Disc levels: Multilevel degenerative disc disease and facet  arthrosis. No spinal canal stenosis. There is severe left C7 foraminal stenosis. Upper chest: Right pleural effusion.  4 mm left apical nodule. Other: Calcific aortic atherosclerosis. IMPRESSION: 1. Mixed subdural and subarachnoid hemorrhage over the posterior left hemisphere without associated mass effect. 2. No acute fracture or static subluxation of the cervical spine. 3. Right pleural effusion. Aortic Atherosclerosis (ICD10-I70.0). Critical Value/emergent  results were called by telephone at the time of interpretation on 12/05/2019 at 7:42 pm to provider JULIE HAVILAND , who verbally acknowledged these results. Electronically Signed   By: Ulyses Jarred M.D.   On: 12/05/2019 19:43   CT Cervical Spine Wo Contrast  Result Date: 12/05/2019 CLINICAL DATA:  Fall EXAM: CT HEAD WITHOUT CONTRAST CT CERVICAL SPINE WITHOUT CONTRAST TECHNIQUE: Multidetector CT imaging of the head and cervical spine was performed following the standard protocol without intravenous contrast. Multiplanar CT image reconstructions of the cervical spine were also generated. COMPARISON:  None. FINDINGS: CT HEAD FINDINGS Brain: There is mixed subdural and subarachnoid hemorrhage over the posterior left hemisphere. There is no associated mass effect. Subdural component measures 6 mm. There is generalized atrophy without lobar predilection. There is hypoattenuation of the periventricular white matter, most commonly indicating chronic ischemic microangiopathy. Vascular: No abnormal hyperdensity of the major intracranial arteries or dural venous sinuses. No intracranial atherosclerosis. Skull: The visualized skull base, calvarium and extracranial soft tissues are normal. Sinuses/Orbits: Chronic right maxillary sinusitis with possible antrochoanal polyp. The orbits are normal. CT CERVICAL SPINE FINDINGS Alignment: No static subluxation. Facets are aligned. Occipital condyles are normally positioned. Skull base and vertebrae: No acute fracture. Soft tissues and spinal canal: No prevertebral fluid or swelling. No visible canal hematoma. Disc levels: Multilevel degenerative disc disease and facet arthrosis. No spinal canal stenosis. There is severe left C7 foraminal stenosis. Upper chest: Right pleural effusion.  4 mm left apical nodule. Other: Calcific aortic atherosclerosis. IMPRESSION: 1. Mixed subdural and subarachnoid hemorrhage over the posterior left hemisphere without associated mass effect. 2. No  acute fracture or static subluxation of the cervical spine. 3. Right pleural effusion. Aortic Atherosclerosis (ICD10-I70.0). Critical Value/emergent results were called by telephone at the time of interpretation on 12/05/2019 at 7:42 pm to provider JULIE HAVILAND , who verbally acknowledged these results. Electronically Signed   By: Ulyses Jarred M.D.   On: 12/05/2019 19:43   ECHOCARDIOGRAM COMPLETE  Result Date: 12/06/2019    ECHOCARDIOGRAM REPORT   Patient Name:   Jeannifer ANIKKA MARSAN Date of Exam: 12/06/2019 Medical Rec #:  [id number]       Height:       64.0 in Accession #:    [id number]      Weight:       165.0 lb Date of Birth:  1919-04-29       BSA:          1.803 m Patient Age:    62 years        BP:           159/66 mmHg Patient Gender: F               HR:           87 bpm. Exam Location:  Inpatient Procedure: 2D Echo, Cardiac Doppler and Color Doppler Indications:    Elevated troponin  History:        Patient has no prior history of Echocardiogram examinations.                 Risk Factors:Hypertension.  Sonographer:  Clayton Lefort RDCS (AE) Referring Phys: [id number] Suann Larry [facility]  Sonographer Comments: Suboptimal subcostal window and Technically challenging study due to limited acoustic windows. Unable to roll patient on left side due to broken ankle. IMPRESSIONS  1. Left ventricular ejection fraction, by estimation, is 60 to 65%. The left ventricle has normal function. The left ventricle has no regional wall motion abnormalities. There is severe left ventricular hypertrophy. Left ventricular diastolic parameters  are consistent with Grade I diastolic dysfunction (impaired relaxation). Elevated left atrial pressure.  2. Right ventricular systolic function is normal. The right ventricular size is normal. There is mildly elevated pulmonary artery systolic pressure.  3. The mitral valve is normal in structure. Trivial mitral valve regurgitation. No evidence of mitral stenosis.  4. The aortic valve is  tricuspid. Aortic valve regurgitation is not visualized. No aortic stenosis is present.  5. The inferior vena cava is normal in size with greater than 50% respiratory variability, suggesting right atrial pressure of 3 mmHg. FINDINGS  Left Ventricle: Left ventricular ejection fraction, by estimation, is 60 to 65%. The left ventricle has normal function. The left ventricle has no regional wall motion abnormalities. The left ventricular internal cavity size was normal in size. There is  severe left ventricular hypertrophy. Left ventricular diastolic parameters are consistent with Grade I diastolic dysfunction (impaired relaxation). Elevated left atrial pressure. Right Ventricle: The right ventricular size is normal.Right ventricular systolic function is normal. There is mildly elevated pulmonary artery systolic pressure. The tricuspid regurgitant velocity is 2.96 m/s, and with an assumed right atrial pressure of  3 mmHg, the estimated right ventricular systolic pressure is 98.4 mmHg. Left Atrium: Left atrial size was normal in size. Right Atrium: Right atrial size was normal in size. Pericardium: There is no evidence of pericardial effusion. Mitral Valve: The mitral valve is normal in structure. Mild mitral annular calcification. Trivial mitral valve regurgitation. No evidence of mitral valve stenosis. MV peak gradient, 10.5 mmHg. The mean mitral valve gradient is 3.0 mmHg. Tricuspid Valve: The tricuspid valve is normal in structure. Tricuspid valve regurgitation is trivial. No evidence of tricuspid stenosis. Aortic Valve: The aortic valve is tricuspid. Aortic valve regurgitation is not visualized. No aortic stenosis is present. Aortic valve mean gradient measures 4.0 mmHg. Aortic valve peak gradient measures 8.0 mmHg. Aortic valve area, by VTI measures 2.52 cm. Pulmonic Valve: The pulmonic valve was not well visualized. Pulmonic valve regurgitation is not visualized. No evidence of pulmonic stenosis. Aorta: The  aortic root is normal in size and structure. Venous: The inferior vena cava is normal in size with greater than 50% respiratory variability, suggesting right atrial pressure of 3 mmHg. IAS/Shunts: No atrial level shunt detected by color flow Doppler.  LEFT VENTRICLE PLAX 2D LVIDd:         2.90 cm  Diastology LVIDs:         2.30 cm  LV e' medial:    3.26 cm/s LV PW:         1.60 cm  LV E/e' medial:  22.1 LV IVS:        1.90 cm  LV e' lateral:   4.13 cm/s LVOT diam:     1.90 cm  LV E/e' lateral: 17.4 LV SV:         58 LV SV Index:   32 LVOT Area:     2.84 cm  RIGHT VENTRICLE             IVC RV S prime:  14.00 cm/s  IVC diam: 1.40 cm TAPSE (M-mode): 1.5 cm LEFT ATRIUM             Index       RIGHT ATRIUM           Index LA diam:        2.10 cm 1.16 cm/m  RA Area:     12.30 cm LA Vol (A2C):   17.2 ml 9.54 ml/m  RA Volume:   25.10 ml  13.92 ml/m LA Vol (A4C):   28.1 ml 15.59 ml/m LA Biplane Vol: 22.8 ml 12.65 ml/m  AORTIC VALVE AV Area (Vmax):    2.57 cm AV Area (Vmean):   2.54 cm AV Area (VTI):     2.52 cm AV Vmax:           141.00 cm/s AV Vmean:          96.600 cm/s AV VTI:            0.231 m AV Peak Grad:      8.0 mmHg AV Mean Grad:      4.0 mmHg LVOT Vmax:         128.00 cm/s LVOT Vmean:        86.400 cm/s LVOT VTI:          0.205 m LVOT/AV VTI ratio: 0.89  AORTA Ao Root diam: 3.50 cm Ao Asc diam:  3.60 cm MITRAL VALVE                TRICUSPID VALVE MV Area (PHT): 2.17 cm     TR Peak grad:   35.0 mmHg MV Peak grad:  10.5 mmHg    TR Vmax:        296.00 cm/s MV Mean grad:  3.0 mmHg MV Vmax:       1.62 m/s     SHUNTS MV Vmean:      84.3 cm/s    Systemic VTI:  0.20 m MV Decel Time: 349 msec     Systemic Diam: 1.90 cm MV E velocity: 72.00 cm/s MV A velocity: 125.00 cm/s MV E/A ratio:  0.58 Kirk Ruths MD Electronically signed by Kirk Ruths MD Signature Date/Time: 12/06/2019/2:45:54 PM    Final     Assessment/Plan 1. Failure to thrive syndrome, adult -she had been declining prior to this fall with  poor po intake and weight loss of 20 lbs over a month -goals for her based on her perspectives are comfort-based--she "just wants to go" -continue supportive and comfort care, but is to be getting some rehab to see if she can improve--she is very upset about loss of independence  2. Traumatic subdural hemorrhage without loss of consciousness, initial encounter [facility]) -is for f/u imaging in 2-3 wks, imaging in hospital had been stable and no new symptoms on exam -asa remains on hold  3. Subarachnoid hemorrhage following injury, no loss of consciousness, initial encounter ([place]) -imaging stable at hospital--no concerning symptoms on exam, f/u again in 2-3 wks  4. Bimalleolar fracture of left ankle, closed, initial encounter -is nonweightbearing in soft cast with plans to f/u with ortho and start hard cast in 2 wks  5. Fall at home, initial encounter -x 2 while using walker--sounds like she may have fx'd the day before admission and then made matters worse when she walked on the ankle more -pain is not adequately controlled at times with tylenol and her daughter reported that she did ok with tramadol so requested for it to be  available daily prn at the lowest dose possible (due to delirium with oxycodone before)  6. Hyponatremia -stable at 126, intake poor, but value did not change with IVFs at hospital--likely due in part to head trauma  7. Elevated troponin -no acute cardiac etiology found during admission -monitor  8. Primary hypertension -cont propranolol which was actually for her tachycardia and benicar  9. Macular degeneration of both eyes, unspecified type -significant, makes her more prone to delirium and she has some issues with #10 at baseline that have been worse in a new environment  10. [name] -due to #9, monitor, try to provide adequate lighting   11. DNR (do not resuscitate) DNR order entered Requests "to go" but euthanasia is not an option in [place]  and has no appetite--says she's just too old Palliative care consulted also A bit early in admission to tell how therapy will go--just here 2 days  12.  CAP -being treated empirically due to fever, hypoxia and sob, but not confirmed with imaging at pt and family prefernce--goals are comfort-based  Family/ staff Communication: discussed with two daughters--one in room and one on phone  Labs/tests ordered:  Cbc, bmp at one week, palliative care  [place]. Annitta Fifield, D.O. [facility] [address], [place] [address] Cell Phone (Mon-Fri 8am-5pm):  [phone] On Call:  [phone] & follow prompts after 5pm & weekends Office Phone:  [phone] Office Fax:  [phone]

## 2019-12-16 ENCOUNTER — Encounter: Payer: Self-pay | Admitting: Family

## 2019-12-16 ENCOUNTER — Observation Stay (HOSPITAL_COMMUNITY): Payer: Medicare Other

## 2019-12-16 ENCOUNTER — Non-Acute Institutional Stay (SKILLED_NURSING_FACILITY): Payer: Medicare Other | Admitting: Family

## 2019-12-16 ENCOUNTER — Emergency Department (HOSPITAL_COMMUNITY): Payer: Medicare Other

## 2019-12-16 ENCOUNTER — Observation Stay (HOSPITAL_COMMUNITY)
Admission: EM | Admit: 2019-12-16 | Discharge: 2019-12-17 | Disposition: A | Discharge status: Home / Self Care | Payer: Medicare Other | Attending: Internal Medicine | Admitting: Internal Medicine

## 2019-12-16 DIAGNOSIS — R29818 Other symptoms and signs involving the nervous system: Secondary | ICD-10-CM | POA: Diagnosis not present

## 2019-12-16 DIAGNOSIS — M6281 Muscle weakness (generalized): Secondary | ICD-10-CM | POA: Diagnosis not present

## 2019-12-16 DIAGNOSIS — S82842A Displaced bimalleolar fracture of left lower leg, initial encounter for closed fracture: Secondary | ICD-10-CM

## 2019-12-16 DIAGNOSIS — M4804 Spinal stenosis, thoracic region: Secondary | ICD-10-CM | POA: Diagnosis not present

## 2019-12-16 DIAGNOSIS — S066X0A Traumatic subarachnoid hemorrhage without loss of consciousness, initial encounter: Secondary | ICD-10-CM | POA: Diagnosis not present

## 2019-12-16 DIAGNOSIS — R29898 Other symptoms and signs involving the musculoskeletal system: Secondary | ICD-10-CM | POA: Diagnosis not present

## 2019-12-16 DIAGNOSIS — R200 Anesthesia of skin: Secondary | ICD-10-CM | POA: Insufficient documentation

## 2019-12-16 DIAGNOSIS — S065X0A Traumatic subdural hemorrhage without loss of consciousness, initial encounter: Secondary | ICD-10-CM | POA: Diagnosis not present

## 2019-12-16 DIAGNOSIS — Z20822 Contact with and (suspected) exposure to covid-19: Secondary | ICD-10-CM | POA: Diagnosis not present

## 2019-12-16 DIAGNOSIS — I10 Essential (primary) hypertension: Secondary | ICD-10-CM | POA: Diagnosis not present

## 2019-12-16 DIAGNOSIS — R627 Adult failure to thrive: Secondary | ICD-10-CM

## 2019-12-16 DIAGNOSIS — J90 Pleural effusion, not elsewhere classified: Secondary | ICD-10-CM | POA: Diagnosis not present

## 2019-12-16 DIAGNOSIS — Z859 Personal history of malignant neoplasm, unspecified: Secondary | ICD-10-CM | POA: Insufficient documentation

## 2019-12-16 DIAGNOSIS — Z79899 Other long term (current) drug therapy: Secondary | ICD-10-CM | POA: Diagnosis not present

## 2019-12-16 DIAGNOSIS — R059 Cough, unspecified: Secondary | ICD-10-CM | POA: Diagnosis not present

## 2019-12-16 DIAGNOSIS — M4802 Spinal stenosis, cervical region: Secondary | ICD-10-CM | POA: Diagnosis not present

## 2019-12-16 DIAGNOSIS — R531 Weakness: Principal | ICD-10-CM | POA: Insufficient documentation

## 2019-12-16 DIAGNOSIS — M546 Pain in thoracic spine: Secondary | ICD-10-CM | POA: Diagnosis not present

## 2019-12-16 DIAGNOSIS — M2578 Osteophyte, vertebrae: Secondary | ICD-10-CM | POA: Diagnosis not present

## 2019-12-16 LAB — URINALYSIS, ROUTINE W REFLEX MICROSCOPIC
Bacteria, UA: NONE SEEN
Bilirubin Urine: NEGATIVE
Glucose, UA: NEGATIVE mg/dL
Hgb urine dipstick: NEGATIVE
Ketones, ur: 20 mg/dL — AB
Nitrite: NEGATIVE
Protein, ur: NEGATIVE mg/dL
Specific Gravity, Urine: 1.016 (ref 1.005–1.030)
pH: 6 (ref 5.0–8.0)

## 2019-12-16 LAB — CBC WITH DIFFERENTIAL/PLATELET
Abs Immature Granulocytes: 0.12 10*3/uL — ABNORMAL HIGH (ref 0.00–0.07)
Basophils Absolute: 0.1 10*3/uL (ref 0.0–0.1)
Basophils Relative: 1 %
Eosinophils Absolute: 0.2 10*3/uL (ref 0.0–0.5)
Eosinophils Relative: 2 %
HCT: 36.8 % (ref 36.0–46.0)
Hemoglobin: 12.6 g/dL (ref 12.0–15.0)
Immature Granulocytes: 1 %
Lymphocytes Relative: 12 %
Lymphs Abs: 1.2 10*3/uL (ref 0.7–4.0)
MCH: 31.4 pg (ref 26.0–34.0)
MCHC: 34.2 g/dL (ref 30.0–36.0)
MCV: 91.8 fL (ref 80.0–100.0)
Monocytes Absolute: 1 10*3/uL (ref 0.1–1.0)
Monocytes Relative: 10 %
Neutro Abs: 7.7 10*3/uL (ref 1.7–7.7)
Neutrophils Relative %: 74 %
Platelets: 334 10*3/uL (ref 150–400)
RBC: 4.01 MIL/uL (ref 3.87–5.11)
RDW: 12.2 % (ref 11.5–15.5)
WBC: 10.2 10*3/uL (ref 4.0–10.5)
nRBC: 0 % (ref 0.0–0.2)

## 2019-12-16 LAB — COMPREHENSIVE METABOLIC PANEL
ALT: 19 U/L (ref 0–44)
AST: 39 U/L (ref 15–41)
Albumin: 2.6 g/dL — ABNORMAL LOW (ref 3.5–5.0)
Alkaline Phosphatase: 103 U/L (ref 38–126)
Anion gap: 11 (ref 5–15)
BUN: 11 mg/dL (ref 8–23)
CO2: 25 mmol/L (ref 22–32)
Calcium: 9.8 mg/dL (ref 8.9–10.3)
Chloride: 89 mmol/L — ABNORMAL LOW (ref 98–111)
Creatinine, Ser: 0.69 mg/dL (ref 0.44–1.00)
GFR, Estimated: >60 mL/min (ref >60)
Glucose, Bld: 137 mg/dL — ABNORMAL HIGH (ref 70–99)
Potassium: 5 mmol/L (ref 3.5–5.1)
Sodium: 125 mmol/L — ABNORMAL LOW (ref 135–145)
Total Bilirubin: 1.4 mg/dL — ABNORMAL HIGH (ref 0.3–1.2)
Total Protein: 6.2 g/dL — ABNORMAL LOW (ref 6.5–8.1)

## 2019-12-16 LAB — CBG MONITORING, ED: Glucose-Capillary: 169 mg/dL — ABNORMAL HIGH (ref 70–99)

## 2019-12-16 LAB — RESPIRATORY PANEL BY RT PCR (FLU A&B, COVID)
Influenza A by PCR: NEGATIVE
Influenza B by PCR: NEGATIVE
SARS Coronavirus 2 by RT PCR: NEGATIVE

## 2019-12-16 LAB — TSH: TSH: 1.599 u[IU]/mL (ref 0.350–4.500)

## 2019-12-16 MED ORDER — ENOXAPARIN SODIUM 40 MG/0.4ML ~~LOC~~ SOLN
40.0000 mg | SUBCUTANEOUS | Status: DC
  Administered 2019-12-16: 40 mg via SUBCUTANEOUS
  Filled 2019-12-16: qty 0.4

## 2019-12-16 MED ORDER — IRBESARTAN 75 MG PO TABS
75.0000 mg | ORAL_TABLET | Freq: Every day | ORAL | Status: DC
  Administered 2019-12-17: 75 mg via ORAL
  Filled 2019-12-16: qty 1

## 2019-12-16 MED ORDER — ACETAMINOPHEN 325 MG PO TABS: 650.0000 mg | ORAL_TABLET | Freq: Four times a day (QID) | ORAL | Status: DC | PRN

## 2019-12-16 MED ORDER — PROPRANOLOL HCL 10 MG PO TABS
10.0000 mg | ORAL_TABLET | Freq: Two times a day (BID) | ORAL | Status: DC
  Administered 2019-12-16 – 2019-12-17 (×2): 10 mg via ORAL
  Filled 2019-12-16 (×3): qty 1

## 2019-12-16 MED ORDER — INSULIN ASPART 100 UNIT/ML ~~LOC~~ SOLN
0.0000 [IU] | Freq: Three times a day (TID) | SUBCUTANEOUS | Status: DC
  Administered 2019-12-17: 2 [IU] via SUBCUTANEOUS
  Administered 2019-12-17: 1 [IU] via SUBCUTANEOUS

## 2019-12-16 MED ORDER — DEXAMETHASONE SODIUM PHOSPHATE 4 MG/ML IJ SOLN
4.0000 mg | Freq: Three times a day (TID) | INTRAMUSCULAR | Status: DC
  Administered 2019-12-16 – 2019-12-17 (×2): 4 mg via INTRAVENOUS
  Filled 2019-12-16 (×2): qty 1

## 2019-12-16 MED ORDER — LABETALOL HCL 5 MG/ML IV SOLN: 5.0000 mg | INTRAVENOUS | Status: DC | PRN

## 2019-12-16 MED ORDER — PANTOPRAZOLE SODIUM 40 MG PO TBEC
40.0000 mg | DELAYED_RELEASE_TABLET | Freq: Every day | ORAL | Status: DC
  Administered 2019-12-17: 40 mg via ORAL
  Filled 2019-12-16: qty 1

## 2019-12-16 MED ORDER — ADULT MULTIVITAMIN W/MINERALS CH
1.0000 | ORAL_TABLET | Freq: Every day | ORAL | Status: DC
  Administered 2019-12-17: 1 via ORAL

## 2019-12-16 MED ORDER — SODIUM CHLORIDE 0.9 % IV SOLN: INTRAVENOUS | Status: AC

## 2019-12-16 MED ORDER — INSULIN ASPART 100 UNIT/ML ~~LOC~~ SOLN: 0.0000 [IU] | Freq: Every day | SUBCUTANEOUS | Status: DC

## 2019-12-16 NOTE — Progress Notes (Signed)
Discussed with neurosurgery NP Meyran who independently reviewed MRIs.  Recommendations for IV decadron 4 mg TID while inpatient then steroids taper at discharge.  Not a surgical candidate in the setting of high suspicion for bone metastases seen on CT and MRIs.  Palliative care consulted to establish goals of care.

## 2019-12-16 NOTE — ED Provider Notes (Signed)
[facility] Provider Note   CSN: [id number] Arrival date & time: 12/16/19  1258     History Chief Complaint  Patient presents with  . Extremity Weakness    Faith [facility] is a 86 y.o. female.  The history is provided by the patient and medical records. No language interpreter was used.  Neurologic Problem This is a new problem. The current episode started more than 2 days ago. The problem occurs constantly. The problem has been gradually worsening. Pertinent negatives include no chest pain, no abdominal pain, no headaches and no shortness of breath. Nothing aggravates the symptoms. Nothing relieves the symptoms. She has tried nothing for the symptoms. The treatment provided no relief.       Past Medical History:  Diagnosis Date  . Cancer ([place])   . Hypertension   . Macular degeneration (senile) of retina, unspecified   . Osteoarthrosis, unspecified whether generalized or localized, unspecified site   . Reflux     Patient Active Problem List   Diagnosis Date Noted  . Faith [facility] 12/13/2019  . Macular degeneration of both eyes 12/13/2019  . DNR (do not resuscitate) 12/13/2019  . Failure to thrive Bolton, adult 12/13/2019  . Subdural hemorrhage following injury, no loss of consciousness ([place]) 12/05/2019  . Subarachnoid hemorrhage following injury, no loss of consciousness ([place]) 12/05/2019  . Bimalleolar fracture of left ankle, closed, initial encounter 12/05/2019  . Fall at home, initial encounter 12/05/2019  . Hyponatremia 12/05/2019  . Elevated troponin 12/05/2019  . Hypertension   . Degeneration of lumbar intervertebral disc 12/18/2017    Past Surgical History:  Procedure Laterality Date  . APPENDECTOMY    . TONSILLECTOMY       OB History   No obstetric history on file.     No family history on file.  Social History   Tobacco Use  . Smoking status: Never Smoker  . Smokeless tobacco: Never Used    Substance Use Topics  . Alcohol use: No  . Drug use: No    Home Medications Prior to Admission medications   Medication Sig Start Date End Date Taking? Authorizing Provider  acetaminophen (TYLENOL) 325 MG tablet Take 650 mg by mouth every 6 (six) hours as needed for mild pain.     [provider]  BENICAR 20 MG tablet Take 20 mg by mouth daily.  06/13/13   [provider]  bisacodyl (BISACODYL LAXATIVE) 10 MG suppository Place 10 mg rectally as needed for moderate constipation.    [provider]  Calcium-Vitamin D-Vitamin K (VIACTIV PO) Take 650 mg by mouth.    [provider]  docusate sodium (COLACE) 100 MG capsule Take 100 mg by mouth daily as needed for mild constipation.    [provider]  glucosamine-chondroitin 500-400 MG tablet Take 1 tablet by mouth daily.     [provider]  Influenza vac split quadrivalent PF (FLUZONE HIGH-DOSE) 0.5 ML injection Fluzone High-Dose 2018-2019 (PF) 180 mcg/0.5 mL intramuscular syringe  TO BE ADMINISTERED BY PHARMACIST FOR IMMUNIZATION    [provider]  loratadine (CLARITIN) 10 MG tablet Take 10 mg by mouth daily as needed for allergies.     [provider]  magnesium hydroxide (MILK OF MAGNESIA) 400 MG/5ML suspension Take by mouth daily as needed for mild constipation.    [provider]  Multiple Vitamins-Minerals (CENTRUM SILVER ADULT 50+ PO) Take 1 tablet by mouth daily.     [provider]  omeprazole (PRILOSEC) 20 MG capsule Take 20 mg by mouth daily.  06/13/13   [provider]  Polyethyl Glycol-Propyl Glycol (SYSTANE ULTRA) 0.4-0.3 % SOLN Place 1 drop into both eyes 2 (two) times daily as needed (dry eyes).    [provider]  propranolol (INDERAL) 10 MG tablet Take 10 mg by mouth See admin instructions. Taking 1 (10mg )  tablet two or three times daily 05/16/13   [provider]  Sodium Phosphates (RA SALINE ENEMA RE) Place  rectally as needed.    [provider]  traMADol (ULTRAM) 50 MG tablet Take 0.5 tablets (25 mg total) by mouth 2 (two) times daily as needed for up to 14 days for severe pain. 12/13/19 12/27/19  Bolton, Faith L, DO    Allergies    Ciprofloxacin, Clindamycin/lincomycin, Diphenoxylate-atropine, and Metronidazole  Review of Systems   Review of Systems  Constitutional: Negative for chills, diaphoresis, fatigue and fever.  HENT: Negative for congestion.   Eyes: Negative for visual disturbance.  Respiratory: Negative for cough, chest tightness, shortness of breath and wheezing.   Cardiovascular: Negative for chest pain, palpitations and leg swelling.  Gastrointestinal: Negative for abdominal pain, constipation, diarrhea, nausea and vomiting.  Genitourinary: Negative for dysuria.  Musculoskeletal: Negative for back pain and neck pain.  Neurological: Positive for weakness and numbness. Negative for facial asymmetry, light-headedness and headaches.  Psychiatric/Behavioral: Negative for agitation and confusion.  All other systems reviewed and are negative.   Physical Exam Updated Vital Signs BP (!) 158/74   Pulse 73   Resp (!) 26   SpO2 100%   Physical Exam Vitals and nursing note reviewed.  Constitutional:      General: She is not in acute distress.    Appearance: She is well-developed. She is not ill-appearing, toxic-appearing or diaphoretic.  HENT:     Head: Normocephalic and atraumatic.     Nose: No congestion or rhinorrhea.     Mouth/Throat:     Mouth: Mucous membranes are moist.     Pharynx: No oropharyngeal exudate or posterior oropharyngeal erythema.  Eyes:     Extraocular Movements: Extraocular movements intact.     Conjunctiva/sclera: Conjunctivae normal.     Pupils: Pupils are equal, round, and reactive to light.  Cardiovascular:     Rate and Rhythm: Normal rate and regular rhythm.     Heart sounds: No murmur heard.   Pulmonary:     Effort: Pulmonary effort  is normal. No respiratory distress.     Breath sounds: Normal breath sounds. No rhonchi or rales.  Chest:     Chest wall: No tenderness.  Abdominal:     General: Abdomen is flat.     Palpations: Abdomen is soft.     Tenderness: There is no abdominal tenderness. There is no guarding or rebound.  Musculoskeletal:        General: No tenderness.     Cervical back: Neck supple. No tenderness.  Skin:    General: Skin is warm and dry.     Findings: No erythema.  Neurological:     Mental Status: She is alert and oriented to person, place, and time.     Sensory: Sensory deficit present.     Motor: Weakness and abnormal muscle tone present.     Comments: Numbness and weakness of the right arm and right leg compared to left.  No facial droop.  Clear speech.  Symmetric smile.  Pupil exam unremarkable with normal extraocular movements.  Psychiatric:  Mood and Affect: Mood normal.     ED Results / Procedures / Treatments   Labs (all labs ordered are listed, but only abnormal results are displayed) Labs Reviewed  CBC WITH DIFFERENTIAL/PLATELET - Abnormal; Notable for the following components:      Result Value   Abs Immature Granulocytes 0.12 (*)    All other components within normal limits  COMPREHENSIVE METABOLIC PANEL - Abnormal; Notable for the following components:   Sodium 125 (*)    Chloride 89 (*)    Glucose, Bld 137 (*)    Total Protein 6.2 (*)    Albumin 2.6 (*)    Total Bilirubin 1.4 (*)    All other components within normal limits  RESPIRATORY PANEL BY RT PCR (FLU A&B, COVID)  URINE CULTURE  URINALYSIS, ROUTINE W REFLEX MICROSCOPIC  TSH    EKG None  Radiology CT Head Wo Contrast  Result Date: 12/16/2019 CLINICAL DATA:  Acute neuro deficit.  Rule out stroke. EXAM: CT HEAD WITHOUT CONTRAST TECHNIQUE: Contiguous axial images were obtained from the base of the skull through the vertex without intravenous contrast. COMPARISON:  CT head 12/07/2019 FINDINGS: Brain:  Small amount of residual subarachnoid hemorrhage over the left convexity improved on the prior study. Improving left convexity subdural hematoma which is now smaller and lower density. Interhemispheric subdural hematoma has resolved. Small left tentorial subdural hematoma has improved. No new area of hemorrhage or infarction. No mass lesion. There is atrophy and chronic ischemic change in the white matter bilaterally. Vascular: Negative for hyperdense vessel Skull: 1 cm lytic lesion left parietal bone with thinning of the inner table. This is unchanged from recent studies but not present in 2008. No other skull lesion. Sinuses/Orbits: Extensive mucosal edema right maxillary sinus now with air-fluid level. Calcified secretions in the right maxillary sinus extending into the nasal cavity. Bony thickening of the right maxillary sinus. Remaining sinuses clear. Bilateral cataract extraction Other: None IMPRESSION: Continued improvement of left-sided subarachnoid hemorrhage and subdural hematoma compatible with prior hemorrhage. No new area of intracranial infarct or hemorrhage. 1 cm lytic lesion left parietal bone suspicious for metastatic disease or myeloma. Electronically Signed   By: Franchot Gallo M.D.   On: 12/16/2019 13:44   DG Chest Portable 1 View  Result Date: 12/16/2019 CLINICAL DATA:  Increased right-sided weakness, cough EXAM: PORTABLE CHEST 1 VIEW COMPARISON:  None. FINDINGS: Blunting of the right costophrenic angle, which may reflect persistent trace pleural effusion or pleural thickening. Minimal atelectasis/scarring at the right lung base. No pneumothorax. Stable cardiomediastinal contours with normal heart size. IMPRESSION: Minimal atelectasis/scarring at the right lung base. Distant trace right pleural effusion or pleural thickening. Electronically Signed   By: Macy Mis M.D.   On: 12/16/2019 14:17    Procedures Procedures (including critical care time)  Medications Ordered in  ED Medications - No data to display  ED Course  I have reviewed the triage vital signs and the nursing notes.  Pertinent labs & imaging results that were available during my care of the patient were reviewed by me and considered in my medical decision making (see chart for details).    MDM Rules/Calculators/A&P                          Faith [facility] is a 86 y.o. female with a past medical history significant for hypertension, osteoarthritis, DNR status, and recent admission for left ankle fracture and subdural/subarachnoid hemorrhage after a fall who presents  with worsened right-sided weakness.  According to EMS report to nursing and from patient, for the last 3 days, she has had worsening weakness on her right side.  She is now not able to lift her hand or leg against gravity which she could do before.  When she was unable to feed result today, patient was sent in to the emergency [facility].  Patient chart review shows that she also had possible pneumonia during her recent admission but denies any new chest pain, shortness of breath, or cough.  She reports she always has some chronic nonproductive cough.  She denies any new leg pain, leg swelling, constipation, diarrhea, or urinary symptoms.  She denies any new traumas.  She denies any headache, vision changes, or speech difficulties.  On exam, patient does have weakness in the right side compared to left and arm and leg.  She could not hold her right arm against gravity and had decreased grip strength compared to left.  She had some neglect with testing sensation on either side.  She had some numbness on the right arm.  Also numbness on right leg compared to left.  Palpable pulses in all extremities.  Chest nontender abdomen nontender.  Lungs clear.  Pupils symmetric and reactive normal extraocular movements.  Clear speech.  She is alert and oriented.  Clinically I am somewhat concerned about worsened intracranial bleeding.  She is not on blood  thinners.  We will get a stat head CT to look for worsened symptoms given the worsening neurologic deficits.  This also could be worsening in the setting of some other factors will check for occult infection with chest x-ray and urinalysis.  We will get other screening labs.  Anticipate reassessment after imaging and work-up.   CT head did not show worsened bleeding.  I spoke to neurology who recommended MRI and EEG to look for new stroke or focal seizure causing the right arm and right leg weakness.  Given her severely worsened mobility, neurology recommended admission for PT/OT as well as further work-up and management.  She is also waiting on her urinalysis and other labs prior to admission.  Care transferred to oncoming team while awaiting work-up to complete prior to admission for new right arm and right leg weakness with recent intracranial hemorrhage.  Final Clinical Impression(s) / ED Diagnoses Final diagnoses:  Right arm weakness  Right leg weakness  Right sided numbness     Clinical Impression: 1. Right arm weakness   2. Right leg weakness   3. Right sided numbness     Disposition: Care transferred to oncoming team while awaiting work-up to complete prior to admission for new right arm and right leg weakness with recent intracranial hemorrhage.  This note was prepared with assistance of Systems analyst. Occasional wrong-word or sound-a-like substitutions may have occurred due to the inherent limitations of voice recognition software.      Celina Shiley, Gwenyth Allegra, MD 12/16/19 [id number]

## 2019-12-16 NOTE — Progress Notes (Signed)
Location:    [facility]   [facility] Room Number: 425-W Place of Service:  SNF (31) Provider:  Marlowe Sax, NP    Patient Care Team: Prince Solian, MD as PCP - General (Internal Medicine)  Extended Emergency Contact Information Primary Emergency Contact: Bennett,June Address: [address]          [place], [place] [address] Johnnette L[place] of [place] Phone: [phone] Mobile Phone: [phone] Relation: Daughter  Code Status:  DNR Goals of care: Advanced Directive information Advanced Directives 12/16/2019  Does Patient Have a Medical Advance Directive? Yes  Type of Advance Directive Out of facility DNR (pink MOST or yellow form)  Does patient want to make changes to medical advance directive? No - Patient declined  Would patient like information on creating a medical advance directive? -     Chief Complaint  Patient presents with   Acute Visit    Send to ED ASAP New Onset of Right Hand Weakness.    HPI:  Pt is a 86 y.o. female seen today for an acute visit for evaluation of new onset of right hand weakness.she is seen in her room per facility Nurse request.Nurse reports patient unable to use or lift right hand.States patient has been using right hand to feed herself.she denies any pain on hand,shoulder or neck.she is worried about use of her hand.she denies any facial weakness,drooping or slurred speech. Of note,she is status post Hospital admission from 12/05/2019 -12/10/2019 for fall while walking to the bathroom hitting her head.she had fallen again previous day prior to hospital admission but had declined to go to ED.she was found to have SDH/SAH,a bimalleolar ankle fracture and Na+ 129.she was admitted and her ASA was held.Neurosurgery was consulted.Her repeat Head CT scan showed slight increase in size at 24 hrs but remained stable.Neuro felt no intervention was necessary another repeat Ct scan after 48 hrs showed no change from the 24 hrs scan.    She had mental status change on hospital day # 2 after a dose of oxycodone She was also treated for empirically with Ceftriaxone for possible Pneumonia. AMS resolved.  Ankle fracture non operative management and no weight bearing was recommended.short leg splint for a total of  6 weeks then plan to transfer into a cast at 2 weeks. Outpatient follow up with Dr.Rogers in 1 week and Neurosurgery with Weston Brass ,NP in 2 weeks was recommended.Incentive spirometer 6 x per day.she was discharge to SNF and Palliative Care to follow at SNF.Facility Nurse states order has been faxed to palliate care but has not been seen yet. Per Nurse patient's daughter would like right hand evaluated.  She denies any fever,chills or cough.    Past Medical History:  Diagnosis Date   Cancer ([place])    Hypertension    Macular degeneration (senile) of retina, unspecified    Osteoarthrosis, unspecified whether generalized or localized, unspecified site    Reflux    Past Surgical History:  Procedure Laterality Date   APPENDECTOMY     TONSILLECTOMY      Allergies  Allergen Reactions   Ciprofloxacin Other (See Comments)    Pt does not remember    Clindamycin/Lincomycin Diarrhea   Diphenoxylate-Atropine    Metronidazole     Allergies as of 12/16/2019      Reactions   Ciprofloxacin Other (See Comments)   Pt does not remember    Clindamycin/lincomycin Diarrhea   Diphenoxylate-atropine    Metronidazole  Medication List       Accurate as of December 16, 2019 11:50 AM. If you have any questions, ask your nurse or doctor.        acetaminophen 325 MG tablet Commonly known as: TYLENOL Take 650 mg by mouth every 6 (six) hours as needed for mild pain.   Benicar 20 MG tablet Generic drug: olmesartan Take 20 mg by mouth daily.   Bisacodyl Laxative 10 MG suppository Generic drug: bisacodyl Place 10 mg rectally as needed for moderate constipation.   CENTRUM SILVER ADULT 50+ PO Take 1  tablet by mouth daily.   docusate sodium 100 MG capsule Commonly known as: COLACE Take 100 mg by mouth daily as needed for mild constipation.   glucosamine-chondroitin 500-400 MG tablet Take 1 tablet by mouth daily.   Influenza vac split quadrivalent PF 0.5 ML injection Commonly known as: FLUZONE HIGH-DOSE Fluzone High-Dose 2018-2019 (PF) 180 mcg/0.5 mL intramuscular syringe  TO BE ADMINISTERED BY PHARMACIST FOR IMMUNIZATION   loratadine 10 MG tablet Commonly known as: CLARITIN Take 10 mg by mouth daily as needed for allergies.   magnesium hydroxide 400 MG/5ML suspension Commonly known as: MILK OF MAGNESIA Take by mouth daily as needed for mild constipation.   omeprazole 20 MG capsule Commonly known as: PRILOSEC Take 20 mg by mouth daily.   propranolol 10 MG tablet Commonly known as: INDERAL Take 10 mg by mouth See admin instructions. Taking 1 (10mg )  tablet two or three times daily   RA SALINE ENEMA RE Place rectally as needed.   Systane Ultra 0.4-0.3 % Soln Generic drug: Polyethyl Glycol-Propyl Glycol Place 1 drop into both eyes 2 (two) times daily as needed (dry eyes).   traMADol 50 MG tablet Commonly known as: ULTRAM Take 0.5 tablets (25 mg total) by mouth 2 (two) times daily as needed for up to 14 days for severe pain.   VIACTIV PO Take 650 mg by mouth.       Review of Systems  Constitutional: Negative for appetite change, chills, fatigue and fever.  HENT: Negative for congestion, rhinorrhea, sinus pressure, sinus pain, sneezing, sore throat and trouble swallowing.   Respiratory: Negative for cough, chest tightness, shortness of breath and wheezing.        Oxygen via nasal cannula   Cardiovascular: Negative for chest pain, palpitations and leg swelling.  Gastrointestinal: Negative for abdominal distention, constipation, diarrhea, nausea and vomiting.  Musculoskeletal: Positive for arthralgias. Negative for joint swelling, myalgias and neck pain.        Non-weight bearing   Skin: Negative for color change, pallor and rash.  Neurological: Positive for weakness. Negative for dizziness, speech difficulty, light-headedness and headaches.       New onset right hand weakness   Psychiatric/Behavioral: Negative for agitation, behavioral problems, confusion and sleep disturbance. The patient is not nervous/anxious.     Immunization History  Administered Date(s) Administered   Influenza, High Dose Seasonal PF 12/29/2017   Influenza-Unspecified 12/06/2018   Pertinent  Health Maintenance Due  Topic Date Due   DEXA SCAN  Never done   PNA vac Low Risk Adult (1 of 2 - PCV13) Never done   INFLUENZA VACCINE  10/06/2019   No flowsheet data found. Functional Status Survey:    Vitals:   12/16/19 1138  BP: (!) 146/82  Pulse: 88  Resp: 17  Temp: 98.2 F (36.8 C)  Weight: 159 lb (72.1 kg)  Height: 5\' 4"  (1.626 m)   Body mass index is 27.29 kg/m. Physical  Exam Vitals and nursing note reviewed.  Constitutional:      General: She is not in acute distress.    Appearance: She is overweight. She is not ill-appearing.     Interventions: Nasal cannula in place.  HENT:     Head: Normocephalic.     Nose: Nose normal. No congestion or rhinorrhea.     Mouth/Throat:     Mouth: Mucous membranes are moist.     Pharynx: Oropharynx is clear. No oropharyngeal exudate or posterior oropharyngeal erythema.  Eyes:     General: No scleral icterus.       Right eye: No discharge.        Left eye: No discharge.     Conjunctiva/sclera: Conjunctivae normal.     Pupils: Pupils are equal, round, and reactive to light.  Cardiovascular:     Rate and Rhythm: Normal rate and regular rhythm.     Pulses: Normal pulses.     Heart sounds: No murmur heard.  No friction rub. No gallop.   Pulmonary:     Effort: Pulmonary effort is normal. No respiratory distress.     Breath sounds: No wheezing, rhonchi or rales.     Comments: Bilateral lung sounds diminished clear  on 2 liters oxygen via nasal cannula  Chest:     Chest wall: No tenderness.  Abdominal:     General: Bowel sounds are normal. There is no distension.     Palpations: Abdomen is soft. There is no mass.     Tenderness: There is no abdominal tenderness. There is no guarding or rebound.  Musculoskeletal:        General: No swelling or tenderness.     Right hand: No swelling or tenderness. Decreased strength. Normal strength of finger abduction and wrist extension. Normal capillary refill. Normal pulse.     Cervical back: Normal range of motion. No rigidity or tenderness.     Right lower leg: No edema.     Comments: Unable to raise right hand   Lymphadenopathy:     Cervical: No cervical adenopathy.  Skin:    General: Skin is warm and dry.     Coloration: Skin is not pale.     Findings: No bruising or erythema.  Neurological:     Mental Status: She is alert. Mental status is at baseline.     Cranial Nerves: Cranial nerves are intact.     Motor: Weakness present.     Comments: Right hand grip weak unable to touch fingers with thumb.No slurred speech or facial drooping noted  Psychiatric:        Mood and Affect: Mood normal.        Speech: Speech normal.        Behavior: Behavior normal.        Thought Content: Thought content normal.    Labs reviewed: Recent Labs    12/05/19 1815 12/06/19 0435  NA 126* 126*  K 4.7 3.9  CL 89* 91*  CO2 24 22  GLUCOSE 169* 150*  BUN 9 6*  CREATININE 0.81 0.74  CALCIUM 10.0 9.2   Recent Labs    12/05/19 1815  AST 42*  ALT 17  ALKPHOS 95  BILITOT 1.0  PROT 6.9  ALBUMIN 3.6   Recent Labs    12/05/19 1815 12/06/19 0435  WBC 9.1 7.9  NEUTROABS 7.1  --   HGB 13.2 11.9*  HCT 38.7 34.1*  MCV 90.2 90.7  PLT 282 242   No results found for:  TSH No results found for: HGBA1C No results found for: CHOL, HDL, LDLCALC, LDLDIRECT, TRIG, CHOLHDL  Significant Diagnostic Results in last 30 days:  DG Chest 2 View  Result Date:  12/05/2019 CLINICAL DATA:  86 year old post fall walking to the bathroom today. EXAM: CHEST - 2 VIEW COMPARISON:  Remote radiograph 01/28/2007 FINDINGS: Lung volumes are low. Upper normal heart size. Mild aortic tortuosity. Aortic atherosclerosis. Subsegmental opacities in the lung bases typical of atelectasis. Possible small pleural effusions. No pneumothorax or confluent airspace disease. No acute osseous abnormalities are seen. Scoliotic curvature of the upper lumbar spine. IMPRESSION: Low lung volumes with bibasilar atelectasis and possible small pleural effusions. Aortic Atherosclerosis (ICD10-I70.0). Electronically Signed   By: Keith Rake M.D.   On: 12/05/2019 19:40   DG Pelvis 1-2 Views  Result Date: 12/05/2019 CLINICAL DATA:  [place] while going to bathroom EXAM: PELVIS - 1-2 VIEW COMPARISON:  None. FINDINGS: SI joints are non widened. Pubic symphysis and rami appear intact. No fracture or malalignment. Mild to moderate degenerative change of the hips. IMPRESSION: No acute osseous abnormality. Electronically Signed   By: Donavan Foil M.D.   On: 12/05/2019 19:39   DG Ankle Complete Left  Result Date: 12/05/2019 CLINICAL DATA:  Fall.  Left foot and ankle pain.  Ankle swelling. EXAM: LEFT ANKLE COMPLETE - 3+ VIEW COMPARISON:  None. FINDINGS: Three views study shows an oblique fracture of the distal fibula, proximal to the tibiotalar joint. Associated fracture of the medial malleolus evident. Lateral film shows no definite posterior lip fracture of the distal tibia. No evidence for subluxation. Bones are diffusely demineralized. Overlying soft tissue swelling evident. IMPRESSION: Bimalleolar ankle fracture without gross subluxation. Electronically Signed   By: Misty Stanley M.D.   On: 12/05/2019 19:40   CT HEAD WO CONTRAST  Result Date: 12/07/2019 CLINICAL DATA:  Follow-up subarachnoid hemorrhage EXAM: CT HEAD WITHOUT CONTRAST TECHNIQUE: Contiguous axial images were obtained from the base of the  skull through the vertex without intravenous contrast. COMPARISON:  12/06/2019 FINDINGS: Brain: Unchanged subdural hematoma about the left hemispheric vertex measuring up to 7 mm in thickness (series 5, image 42). Unchanged minimal subdural hemorrhage about the left aspect of the falx (series 4, image 22). Unchanged foci of subarachnoid hemorrhage within the sulci about the left parietal vertex (series 4, image 24). No significant mass effect midline shift. Periventricular and deep white matter hypodensity with mild global volume loss. No evidence of acute infarction or hydrocephalus. Vascular: No hyperdense vessel or unexpected calcification. Skull: Normal. Negative for fracture or focal lesion. Sinuses/Orbits: Total opacification of the partially imaged right maxillary sinus with a calcified lesion at the antrum (series 3, image 5). Other: None. IMPRESSION: 1. Unchanged subdural hematoma about the left hemispheric vertex measuring up to 7 mm in thickness. Unchanged minimal subdural hemorrhage about the left aspect of the falx. 2. Unchanged foci of subarachnoid hemorrhage within the sulci about the left parietal vertex. 3. No significant mass effect or midline shift. 4. Small-vessel white matter disease and mild global volume loss in keeping with advanced patient age. 5. Total opacification of the partially imaged right maxillary sinus with a calcified lesion at the antrum. Electronically Signed   By: Eddie Candle M.D.   On: 12/07/2019 14:47   CT HEAD WO CONTRAST  Result Date: 12/06/2019 CLINICAL DATA:  Follow-up subarachnoid hemorrhage EXAM: CT HEAD WITHOUT CONTRAST TECHNIQUE: Contiguous axial images were obtained from the base of the skull through the vertex without intravenous contrast. COMPARISON:  Yesterday FINDINGS:  Brain: Subdural hematoma along the left frontal parietal convexity with mild increase from before. Maximal thickness is measured in the parietal region at 7 mm. Interhemispheric subdural  continuation is now seen. There is patchy subarachnoid hemorrhage greatest at the left vertex, mildly increased from before. No brain swelling or infarct seen. No hydrocephalus. Generalized brain atrophy Vascular: Atherosclerotic calcification Skull: No acute finding Sinuses/Orbits: Calcified mass at the right nasal cavity and medial right maxillary sinus with chronic right maxillary sinusitis. IMPRESSION: 1. Left convexity subdural hematoma with increase from yesterday, now up to 7 mm in thickness along the parietal lobe. 2. Mild increase in patchy subarachnoid hemorrhage at the left vertex. 3. No midline shift. 4. Chronic calcified mass obstructing the right maxillary sinus. Electronically Signed   By: Monte Fantasia M.D.   On: 12/06/2019 07:44   CT Head Wo Contrast  Result Date: 12/05/2019 CLINICAL DATA:  Fall EXAM: CT HEAD WITHOUT CONTRAST CT CERVICAL SPINE WITHOUT CONTRAST TECHNIQUE: Multidetector CT imaging of the head and cervical spine was performed following the standard protocol without intravenous contrast. Multiplanar CT image reconstructions of the cervical spine were also generated. COMPARISON:  None. FINDINGS: CT HEAD FINDINGS Brain: There is mixed subdural and subarachnoid hemorrhage over the posterior left hemisphere. There is no associated mass effect. Subdural component measures 6 mm. There is generalized atrophy without lobar predilection. There is hypoattenuation of the periventricular white matter, most commonly indicating chronic ischemic microangiopathy. Vascular: No abnormal hyperdensity of the major intracranial arteries or dural venous sinuses. No intracranial atherosclerosis. Skull: The visualized skull base, calvarium and extracranial soft tissues are normal. Sinuses/Orbits: Chronic right maxillary sinusitis with possible antrochoanal polyp. The orbits are normal. CT CERVICAL SPINE FINDINGS Alignment: No static subluxation. Facets are aligned. Occipital condyles are normally  positioned. Skull base and vertebrae: No acute fracture. Soft tissues and spinal canal: No prevertebral fluid or swelling. No visible canal hematoma. Disc levels: Multilevel degenerative disc disease and facet arthrosis. No spinal canal stenosis. There is severe left C7 foraminal stenosis. Upper chest: Right pleural effusion.  4 mm left apical nodule. Other: Calcific aortic atherosclerosis. IMPRESSION: 1. Mixed subdural and subarachnoid hemorrhage over the posterior left hemisphere without associated mass effect. 2. No acute fracture or static subluxation of the cervical spine. 3. Right pleural effusion. Aortic Atherosclerosis (ICD10-I70.0). Critical Value/emergent results were called by telephone at the time of interpretation on 12/05/2019 at 7:42 pm to provider JULIE HAVILAND , who verbally acknowledged these results. Electronically Signed   By: Ulyses Jarred M.D.   On: 12/05/2019 19:43   CT Cervical Spine Wo Contrast  Result Date: 12/05/2019 CLINICAL DATA:  Fall EXAM: CT HEAD WITHOUT CONTRAST CT CERVICAL SPINE WITHOUT CONTRAST TECHNIQUE: Multidetector CT imaging of the head and cervical spine was performed following the standard protocol without intravenous contrast. Multiplanar CT image reconstructions of the cervical spine were also generated. COMPARISON:  None. FINDINGS: CT HEAD FINDINGS Brain: There is mixed subdural and subarachnoid hemorrhage over the posterior left hemisphere. There is no associated mass effect. Subdural component measures 6 mm. There is generalized atrophy without lobar predilection. There is hypoattenuation of the periventricular white matter, most commonly indicating chronic ischemic microangiopathy. Vascular: No abnormal hyperdensity of the major intracranial arteries or dural venous sinuses. No intracranial atherosclerosis. Skull: The visualized skull base, calvarium and extracranial soft tissues are normal. Sinuses/Orbits: Chronic right maxillary sinusitis with possible  antrochoanal polyp. The orbits are normal. CT CERVICAL SPINE FINDINGS Alignment: No static subluxation. Facets are aligned. Occipital condyles are normally  positioned. Skull base and vertebrae: No acute fracture. Soft tissues and spinal canal: No prevertebral fluid or swelling. No visible canal hematoma. Disc levels: Multilevel degenerative disc disease and facet arthrosis. No spinal canal stenosis. There is severe left C7 foraminal stenosis. Upper chest: Right pleural effusion.  4 mm left apical nodule. Other: Calcific aortic atherosclerosis. IMPRESSION: 1. Mixed subdural and subarachnoid hemorrhage over the posterior left hemisphere without associated mass effect. 2. No acute fracture or static subluxation of the cervical spine. 3. Right pleural effusion. Aortic Atherosclerosis (ICD10-I70.0). Critical Value/emergent results were called by telephone at the time of interpretation on 12/05/2019 at 7:42 pm to provider JULIE HAVILAND , who verbally acknowledged these results. Electronically Signed   By: Ulyses Jarred M.D.   On: 12/05/2019 19:43   ECHOCARDIOGRAM COMPLETE  Result Date: 12/06/2019    ECHOCARDIOGRAM REPORT   Patient Name:   Jaxson CATHERINE CUBERO Date of Exam: 12/06/2019 Medical Rec #:  [id number]       Height:       64.0 in Accession #:    [id number]      Weight:       165.0 lb Date of Birth:  1920/01/04       BSA:          1.803 m Patient Age:    52 years        BP:           159/66 mmHg Patient Gender: F               HR:           87 bpm. Exam Location:  Inpatient Procedure: 2D Echo, Cardiac Doppler and Color Doppler Indications:    Elevated troponin  History:        Patient has no prior history of Echocardiogram examinations.                 Risk Factors:Hypertension.  Sonographer:    Clayton Lefort RDCS (AE) Referring Phys: [id number] [facility]ann [facility]  Sonographer Comments: Suboptimal subcostal window and Technically challenging study due to limited acoustic windows. Unable to roll patient on left side due  to broken ankle. IMPRESSIONS  1. Left ventricular ejection fraction, by estimation, is 60 to 65%. The left ventricle has normal function. The left ventricle has no regional wall motion abnormalities. There is severe left ventricular hypertrophy. Left ventricular diastolic parameters  are consistent with Grade I diastolic dysfunction (impaired relaxation). Elevated left atrial pressure.  2. Right ventricular systolic function is normal. The right ventricular size is normal. There is mildly elevated pulmonary artery systolic pressure.  3. The mitral valve is normal in structure. Trivial mitral valve regurgitation. No evidence of mitral stenosis.  4. The aortic valve is tricuspid. Aortic valve regurgitation is not visualized. No aortic stenosis is present.  5. The inferior vena cava is normal in size with greater than 50% respiratory variability, suggesting right atrial pressure of 3 mmHg. FINDINGS  Left Ventricle: Left ventricular ejection fraction, by estimation, is 60 to 65%. The left ventricle has normal function. The left ventricle has no regional wall motion abnormalities. The left ventricular internal cavity size was normal in size. There is  severe left ventricular hypertrophy. Left ventricular diastolic parameters are consistent with Grade I diastolic dysfunction (impaired relaxation). Elevated left atrial pressure. Right Ventricle: The right ventricular size is normal.Right ventricular systolic function is normal. There is mildly elevated pulmonary artery systolic pressure. The tricuspid regurgitant velocity is 2.96 m/s, and with an  assumed right atrial pressure of  3 mmHg, the estimated right ventricular systolic pressure is 82.9 mmHg. Left Atrium: Left atrial size was normal in size. Right Atrium: Right atrial size was normal in size. Pericardium: There is no evidence of pericardial effusion. Mitral Valve: The mitral valve is normal in structure. Mild mitral annular calcification. Trivial mitral valve  regurgitation. No evidence of mitral valve stenosis. MV peak gradient, 10.5 mmHg. The mean mitral valve gradient is 3.0 mmHg. Tricuspid Valve: The tricuspid valve is normal in structure. Tricuspid valve regurgitation is trivial. No evidence of tricuspid stenosis. Aortic Valve: The aortic valve is tricuspid. Aortic valve regurgitation is not visualized. No aortic stenosis is present. Aortic valve mean gradient measures 4.0 mmHg. Aortic valve peak gradient measures 8.0 mmHg. Aortic valve area, by VTI measures 2.52 cm. Pulmonic Valve: The pulmonic valve was not well visualized. Pulmonic valve regurgitation is not visualized. No evidence of pulmonic stenosis. Aorta: The aortic root is normal in size and structure. Venous: The inferior vena cava is normal in size with greater than 50% respiratory variability, suggesting right atrial pressure of 3 mmHg. IAS/Shunts: No atrial level shunt detected by color flow Doppler.  LEFT VENTRICLE PLAX 2D LVIDd:         2.90 cm  Diastology LVIDs:         2.30 cm  LV e' medial:    3.26 cm/s LV PW:         1.60 cm  LV E/e' medial:  22.1 LV IVS:        1.90 cm  LV e' lateral:   4.13 cm/s LVOT diam:     1.90 cm  LV E/e' lateral: 17.4 LV SV:         58 LV SV Index:   32 LVOT Area:     2.84 cm  RIGHT VENTRICLE             IVC RV S prime:     14.00 cm/s  IVC diam: 1.40 cm TAPSE (M-mode): 1.5 cm LEFT ATRIUM             Index       RIGHT ATRIUM           Index LA diam:        2.10 cm 1.16 cm/m  RA Area:     12.30 cm LA Vol (A2C):   17.2 ml 9.54 ml/m  RA Volume:   25.10 ml  13.92 ml/m LA Vol (A4C):   28.1 ml 15.59 ml/m LA Biplane Vol: 22.8 ml 12.65 ml/m  AORTIC VALVE AV Area (Vmax):    2.57 cm AV Area (Vmean):   2.54 cm AV Area (VTI):     2.52 cm AV Vmax:           141.00 cm/s AV Vmean:          96.600 cm/s AV VTI:            0.231 m AV Peak Grad:      8.0 mmHg AV Mean Grad:      4.0 mmHg LVOT Vmax:         128.00 cm/s LVOT Vmean:        86.400 cm/s LVOT VTI:          0.205 m LVOT/AV  VTI ratio: 0.89  AORTA Ao Root diam: 3.50 cm Ao Asc diam:  3.60 cm MITRAL VALVE                TRICUSPID  VALVE MV Area (PHT): 2.17 cm     TR Peak grad:   35.0 mmHg MV Peak grad:  10.5 mmHg    TR Vmax:        296.00 cm/s MV Mean grad:  3.0 mmHg MV Vmax:       1.62 m/s     SHUNTS MV Vmean:      84.3 cm/s    Systemic VTI:  0.20 m MV Decel Time: 349 msec     Systemic Diam: 1.90 cm MV E velocity: 72.00 cm/s MV A velocity: 125.00 cm/s MV E/A ratio:  0.58 Kirk Ruths MD Electronically signed by Kirk Ruths MD Signature Date/Time: 12/06/2019/2:45:54 PM    Final     Assessment/Plan 1. Right hand weakness New onset of right and weakness unable to feed self or raise hand up.Grip weak and unable to touch each finger with her thumb.Has been using hand without any difficulties.per Nurse daughter request all treatment despite palliative consult. - Send to ED for further evaluation given recent SDH/SAH   2. Bimalleolar fracture of left ankle, closed, initial encounter - soft splint in place no weight bearing. - appointment to follow up with Dr.Rogers after 1 week per hospital discharge.   3. Failure to thrive syndrome, adult Awaiting to be seen by palliative care.  Family/ staff Communication: Reviewed plan with patient and facility Nurse.Orders written to send to ED ASAP for evaluation of new onset right hand weakness.   Labs/tests ordered:  None send to ED

## 2019-12-16 NOTE — ED Provider Notes (Signed)
Signout from Dr. Sherry Ruffing.  86 year old female recent admission for fall with ankle fracture and subarachnoid hemorrhage.  Since then patient has had 3 days of right arm right leg weakness.  Neurology has been consulted and recommended EEG and MRI.  Plan is to follow-up these with neurology and to follow-up on UA.  Needs admission. Physical Exam  BP (!) 158/74   Pulse 73   Resp (!) 26   SpO2 100%   Physical Exam  ED Course/Procedures     Procedures  MDM  Patient's MRI brain showing stable subarachnoid and subdural.  Reviewed with neurology Dr. Lorrin Goodell.  He recommends getting an MRI cervical spine without contrast.  He will see in consult if there are any significant abnormalities.  Recommends admission for further work-up.  Hospitalist has been paged.  4:20 PM-discussed with Dr. Nevada Crane [facility] hospitalist who will see for admission.          Hayden Rasmussen, MD 12/17/19 1044

## 2019-12-16 NOTE — ED Notes (Signed)
This nurse upon examination assessed GU area while putting on purewick. Nurse assessed that pt's vagina was sloughing, unclean and very red. This nurse also noticed skin tearing on left side of inner buttock.

## 2019-12-16 NOTE — ED Notes (Signed)
Unable to round at beginning of shift on pt, still in MRI. Introduced self to family at bedside and provided update on care.

## 2019-12-16 NOTE — ED Triage Notes (Signed)
PT BIBA from [facility]. Pt is at rehab facility after she sustained a fall on 10/06 that resulted in a left ankle fracture and a subarachnoid hemorrhage. Pt started to have right arm weakness and loss of sensation on Saturday. GCS 15. AOX4. Speech is clear. No droop assessed. Resp even and unlabored. Pt unable to move right arm and right leg against gravity. PEERLA.

## 2019-12-16 NOTE — Progress Notes (Signed)
EEG Completed; Results Pending  

## 2019-12-16 NOTE — H&P (Addendum)
History and Physical  Faith Bolton NAT:[id number] DOB: Jul 21, 1919 DOA: 12/16/2019  Referring physician: Dr. Melina Copa  PCP: Prince Solian, MD  Outpatient Specialists: Neurosurgery Patient coming from: SNF  Chief Complaint: Right arm weakness.  HPI: Faith Bolton is a 86 y.o. female with medical history significant for essential hypertension, recent fall at home that resulted in left ankle fracture, subdural and subarachnoid hemorrhage over the posterior left hemisphere.  Admitted on 12/05/2019 and discharged on 12/10/2019 to SNF.  She noticed weakness of her right arm about 2 days ago which has been gradually worsening.  Unable to squeeze or make a fist from her right hand.  Difficulty raising her right arm against gravity.  No pain.  Sensation is intact.  In addition, she reports intermittent nonproductive cough.  No chest pain or palpitations.  Per her daughter at bedside she was recently told by her primary care provider Dr. Dagmar Hait that she may have a lesion on her left breast that is concerning for malignancy.  Patient had declined further work-up due to her age.  She presented to the ED for further evaluation and management.  TR[facility] asked to admit.  ED Course: Lab studies remarkable for serum sodium 125, with baseline of 126.  Serum potassium 5.0 and total bilirubin 1.4.  CT head showed continued improvement of left-sided subarachnoid hemorrhage and subdural hematoma compatible with prior hemorrhage.  No new areas of intracranial infarct or hemorrhage.  1 cm lytic lesion left parietal bone suspicious for metastatic disease or myeloma.  MRI brain showing negative for acute infarct.  Subacute subdural hemorrhage on the left subacute subarachnoid hemorrhage on the left.  Bone marrow lesions in the C2 vertebral body and left parietal bone compatible with metastatic disease.  Review of Systems: Review of systems as noted in the HPI. All other systems reviewed and are negative.   Past Medical  History:  Diagnosis Date  . Cancer ([place])   . Hypertension   . Macular degeneration (senile) of retina, unspecified   . Osteoarthrosis, unspecified whether generalized or localized, unspecified site   . Reflux    Past Surgical History:  Procedure Laterality Date  . APPENDECTOMY    . TONSILLECTOMY      Social History:  reports that she has never smoked. She has never used smokeless tobacco. She reports that she does not drink alcohol and does not use drugs.   Allergies  Allergen Reactions  . Ciprofloxacin Other (See Comments)    Pt does not remember   . Clindamycin/Lincomycin Diarrhea  . Diphenoxylate-Atropine Other (See Comments)    Can't remember  . Metronidazole Other (See Comments)    Can't remember    No family history on file.    Prior to Admission medications   Medication Sig Start Date End Date Taking? Authorizing Provider  acetaminophen (TYLENOL) 325 MG tablet Take 650 mg by mouth every 6 (six) hours as needed for mild pain.    Yes [provider]  BENICAR 20 MG tablet Take 20 mg by mouth daily.  06/13/13  Yes [provider]  Calcium-Vitamin D-Vitamin K (VIACTIV PO) Take 650 mg by mouth daily.    Yes [provider]  cefdinir (OMNICEF) 300 MG capsule Take 300 mg by mouth 2 (two) times daily.   Yes [provider]  docusate sodium (COLACE) 100 MG capsule Take 100 mg by mouth daily as needed for mild constipation.   Yes [provider]  glucosamine-chondroitin 500-400 MG tablet Take 1 tablet by mouth  daily.    Yes [provider]  loratadine (CLARITIN) 10 MG tablet Take 10 mg by mouth daily as needed for allergies.    Yes [provider]  Multiple Vitamins-Minerals (CENTRUM SILVER ADULT 50+ PO) Take 1 tablet by mouth daily.    Yes [provider]  omeprazole (PRILOSEC) 20 MG capsule Take 20 mg by mouth daily.  06/13/13  Yes [provider]  Polyethyl Glycol-Propyl Glycol (SYSTANE ULTRA)  0.4-0.3 % SOLN Place 1 drop into both eyes 2 (two) times daily as needed (dry eyes).   Yes [provider]  propranolol (INDERAL) 10 MG tablet Take 10 mg by mouth 2 (two) times daily.  05/16/13  Yes [provider]  traMADol (ULTRAM) 50 MG tablet Take 0.5 tablets (25 mg total) by mouth 2 (two) times daily as needed for up to 14 days for severe pain. Patient taking differently: Take 25 mg by mouth daily as needed for severe pain.  12/13/19 12/27/19 Yes Reed, Tiffany L, DO    Physical Exam: BP (!) 158/74   Pulse 73   Resp (!) 26   SpO2 100%   . General: 86 y.o. year-old female well developed well nourished in no acute distress.  Alert and oriented x3. . Cardiovascular: Regular rate and rhythm with no rubs or gallops.  No thyromegaly or JVD noted.  Trace lower extremity edema bilaterally. Marland Kitchen Respiratory: Clear to auscultation with no wheezes or rales.  Poor inspiratory effort. . Abdomen: Soft nontender nondistended with normal bowel sounds x4 quadrants. . Muskuloskeletal: No cyanosis or clubbing.  Left lower extremity in cast.  Trace edema noted in the right lower extremity. . Neuro: CN II-XII intact, strength, sensation, reflexes . Skin: No ulcerative lesions noted or rashes . Psychiatry: Judgement and insight appear normal. Mood is appropriate for condition and setting          Labs on Admission:  Basic Metabolic Panel: Recent Labs  Lab 12/16/19 1321  NA 125*  K 5.0  CL 89*  CO2 25  GLUCOSE 137*  BUN 11  CREATININE 0.69  CALCIUM 9.8   Liver Function Tests: Recent Labs  Lab 12/16/19 1321  AST 39  ALT 19  ALKPHOS 103  BILITOT 1.4*  PROT 6.2*  ALBUMIN 2.6*   No results for input(s): LIPASE, AMYLASE in the last 168 hours. No results for input(s): AMMONIA in the last 168 hours. CBC: Recent Labs  Lab 12/16/19 1321  WBC 10.2  NEUTROABS 7.7  HGB 12.6  HCT 36.8  MCV 91.8  PLT 334   Cardiac Enzymes: No results for input(s): CKTOTAL, CKMB, CKMBINDEX,  TROPONINI in the last 168 hours.  BNP (last 3 results) No results for input(s): BNP in the last 8760 hours.  ProBNP (last 3 results) No results for input(s): PROBNP in the last 8760 hours.  CBG: No results for input(s): GLUCAP in the last 168 hours.  Radiological Exams on Admission: CT Head Wo Contrast  Result Date: 12/16/2019 CLINICAL DATA:  Acute neuro deficit.  Rule out stroke. EXAM: CT HEAD WITHOUT CONTRAST TECHNIQUE: Contiguous axial images were obtained from the base of the skull through the vertex without intravenous contrast. COMPARISON:  CT head 12/07/2019 FINDINGS: Brain: Small amount of residual subarachnoid hemorrhage over the left convexity improved on the prior study. Improving left convexity subdural hematoma which is now smaller and lower density. Interhemispheric subdural hematoma has resolved. Small left tentorial subdural hematoma has improved. No new area of hemorrhage or infarction. No mass lesion. There  is atrophy and chronic ischemic change in the white matter bilaterally. Vascular: Negative for hyperdense vessel Skull: 1 cm lytic lesion left parietal bone with thinning of the inner table. This is unchanged from recent studies but not present in 2008. No other skull lesion. Sinuses/Orbits: Extensive mucosal edema right maxillary sinus now with air-fluid level. Calcified secretions in the right maxillary sinus extending into the nasal cavity. Bony thickening of the right maxillary sinus. Remaining sinuses clear. Bilateral cataract extraction Other: None IMPRESSION: Continued improvement of left-sided subarachnoid hemorrhage and subdural hematoma compatible with prior hemorrhage. No new area of intracranial infarct or hemorrhage. 1 cm lytic lesion left parietal bone suspicious for metastatic disease or myeloma. Electronically Signed   By: Franchot Gallo M.D.   On: 12/16/2019 13:44   MR BRAIN WO CONTRAST  Result Date: 12/16/2019 CLINICAL DATA:  Acute neuro deficit. Worsening  right arm and leg weakness. History of recent intracranial hemorrhage. EXAM: MRI HEAD WITHOUT CONTRAST TECHNIQUE: Multiplanar, multiecho pulse sequences of the brain and surrounding structures were obtained without intravenous contrast. COMPARISON:  CT head 12/16/2019.  12/07/2019 FINDINGS: Brain: Negative for acute infarct. Chronic microvascular ischemic changes in the white matter with generalized atrophy Subacute subarachnoid hemorrhage on the left. Subacute left subdural hemorrhage. These have improved from prior CT studies. No intracranial mass lesion. No hydrocephalus or midline shift. Vascular: Normal arterial flow voids. Skull and upper cervical spine: 12 mm bone marrow lesion in the C2 vertebral body. 12 mm lesion left parietal bone. These are concerning for metastatic disease. Sinuses/Orbits: Extensive mucosal edema right maxillary sinus. Bilateral cataract extraction. Other: None IMPRESSION: Negative for acute infarct. Subacute subdural hemorrhage on the left. Subacute subarachnoid hemorrhage on the left. Bone marrow lesions in the C2 vertebral body and left parietal bone compatible with metastatic disease. Electronically Signed   By: Franchot Gallo M.D.   On: 12/16/2019 15:40   DG Chest Portable 1 View  Result Date: 12/16/2019 CLINICAL DATA:  Increased right-sided weakness, cough EXAM: PORTABLE CHEST 1 VIEW COMPARISON:  None. FINDINGS: Blunting of the right costophrenic angle, which may reflect persistent trace pleural effusion or pleural thickening. Minimal atelectasis/scarring at the right lung base. No pneumothorax. Stable cardiomediastinal contours with normal heart size. IMPRESSION: Minimal atelectasis/scarring at the right lung base. Distant trace right pleural effusion or pleural thickening. Electronically Signed   By: Macy Mis M.D.   On: 12/16/2019 14:17    EKG: I independently viewed the EKG done and my findings are as followed: None available at the time of this  visit.  Assessment/Plan Present on Admission: . Right arm weakness  Active Problems:   Right arm weakness  Right arm weakness, rule out cervical spine compression. Presented with right arm weakness, gradually worsening x2 days CT head and MRI brain with findings as stated above. Neurology recommended cervical spine MRI, ordered by EDP, follow results OT to assess  Chronic mid back pain Thoracic spine MRI also ordered, follow results Analgesics as needed  Lytic lesion left parietal bone, suspicious for metastatic disease or myeloma Follow-up MRI cervical spine and thoracic spine  Mild hyperkalemia Presented with serum potassium 5.0 Start gentle IV fluid hydration Repeat BMP in the morning  Hypovolemic hyponatremia, chronic Presented with serum sodium of 125 Baseline serum sodium appears to be 126 Start normal saline at 50 cc/h x 1 day Repeat BMP in the morning  Recent intracranial hemorrhage post fall Monitor Goal BP normotensive  Ambulatory dysfunction post left ankle fracture, fall Follow recommendations per orthopedic surgery  PT Fall precautions  Essential hypertension BP is not at goal Resume home antihypertensives Goal BP normotensive  GERD Resume home PPI   DVT prophylaxis: Subcu Lovenox daily  Code Status: DNR per EDP Dr. Melina Copa  Family Communication: Updated daughter at bedside.  Disposition Plan: Admit to telemetry medical  Consults called: Neurology contacted by EDP  Admission status: Observation status.   Status is: Observation    Dispo:  Patient From: [facility]  Planned Disposition: [place]  Expected discharge date: 12/17/19  Medically stable for discharge: No, ongoing work-up of her right upper extremity weakness.   Kayleen Memos MD [facility] Pager [phone]  If 7PM-7AM, please contact night-coverage [email or web address] Password [facility]  12/16/2019, 5:24 PM

## 2019-12-16 NOTE — ED Notes (Signed)
EEG at bedside, will assess for need for in/out cath when finished

## 2019-12-17 DIAGNOSIS — R5381 Other malaise: Secondary | ICD-10-CM | POA: Diagnosis not present

## 2019-12-17 DIAGNOSIS — C7951 Secondary malignant neoplasm of bone: Secondary | ICD-10-CM | POA: Diagnosis not present

## 2019-12-17 DIAGNOSIS — R569 Unspecified convulsions: Secondary | ICD-10-CM | POA: Diagnosis not present

## 2019-12-17 DIAGNOSIS — M2550 Pain in unspecified joint: Secondary | ICD-10-CM | POA: Diagnosis not present

## 2019-12-17 DIAGNOSIS — Z66 Do not resuscitate: Secondary | ICD-10-CM | POA: Diagnosis not present

## 2019-12-17 DIAGNOSIS — Z8679 Personal history of other diseases of the circulatory system: Secondary | ICD-10-CM | POA: Diagnosis not present

## 2019-12-17 DIAGNOSIS — R29898 Other symptoms and signs involving the musculoskeletal system: Secondary | ICD-10-CM | POA: Diagnosis not present

## 2019-12-17 DIAGNOSIS — Z7401 Bed confinement status: Secondary | ICD-10-CM | POA: Diagnosis not present

## 2019-12-17 DIAGNOSIS — Z7189 Other specified counseling: Secondary | ICD-10-CM

## 2019-12-17 DIAGNOSIS — R531 Weakness: Secondary | ICD-10-CM | POA: Diagnosis not present

## 2019-12-17 DIAGNOSIS — Z515 Encounter for palliative care: Secondary | ICD-10-CM

## 2019-12-17 DIAGNOSIS — I10 Essential (primary) hypertension: Secondary | ICD-10-CM | POA: Diagnosis not present

## 2019-12-17 DIAGNOSIS — C50919 Malignant neoplasm of unspecified site of unspecified female breast: Secondary | ICD-10-CM | POA: Diagnosis not present

## 2019-12-17 LAB — CBC WITH DIFFERENTIAL/PLATELET
Abs Immature Granulocytes: 0.14 10*3/uL — ABNORMAL HIGH (ref 0.00–0.07)
Basophils Absolute: 0 10*3/uL (ref 0.0–0.1)
Basophils Relative: 0 %
Eosinophils Absolute: 0 10*3/uL (ref 0.0–0.5)
Eosinophils Relative: 0 %
HCT: 37.4 % (ref 36.0–46.0)
Hemoglobin: 12.6 g/dL (ref 12.0–15.0)
Immature Granulocytes: 1 %
Lymphocytes Relative: 8 %
Lymphs Abs: 0.8 10*3/uL (ref 0.7–4.0)
MCH: 30.8 pg (ref 26.0–34.0)
MCHC: 33.7 g/dL (ref 30.0–36.0)
MCV: 91.4 fL (ref 80.0–100.0)
Monocytes Absolute: 0.3 10*3/uL (ref 0.1–1.0)
Monocytes Relative: 3 %
Neutro Abs: 8.9 10*3/uL — ABNORMAL HIGH (ref 1.7–7.7)
Neutrophils Relative %: 88 %
Platelets: 356 10*3/uL (ref 150–400)
RBC: 4.09 MIL/uL (ref 3.87–5.11)
RDW: 12.1 % (ref 11.5–15.5)
WBC: 10.2 10*3/uL (ref 4.0–10.5)
nRBC: 0 % (ref 0.0–0.2)

## 2019-12-17 LAB — COMPREHENSIVE METABOLIC PANEL
ALT: 19 U/L (ref 0–44)
AST: 32 U/L (ref 15–41)
Albumin: 2.7 g/dL — ABNORMAL LOW (ref 3.5–5.0)
Alkaline Phosphatase: 104 U/L (ref 38–126)
Anion gap: 14 (ref 5–15)
BUN: 12 mg/dL (ref 8–23)
CO2: 23 mmol/L (ref 22–32)
Calcium: 10 mg/dL (ref 8.9–10.3)
Chloride: 88 mmol/L — ABNORMAL LOW (ref 98–111)
Creatinine, Ser: 0.68 mg/dL (ref 0.44–1.00)
GFR, Estimated: >60 mL/min (ref >60)
Glucose, Bld: 191 mg/dL — ABNORMAL HIGH (ref 70–99)
Potassium: 4.6 mmol/L (ref 3.5–5.1)
Sodium: 125 mmol/L — ABNORMAL LOW (ref 135–145)
Total Bilirubin: 0.8 mg/dL (ref 0.3–1.2)
Total Protein: 6.4 g/dL — ABNORMAL LOW (ref 6.5–8.1)

## 2019-12-17 LAB — URINE CULTURE

## 2019-12-17 LAB — HEMOGLOBIN A1C
Hgb A1c MFr Bld: 7.4 % — ABNORMAL HIGH (ref 4.8–5.6)
Mean Plasma Glucose: 165.68 mg/dL

## 2019-12-17 LAB — CBG MONITORING, ED
Glucose-Capillary: 176 mg/dL — ABNORMAL HIGH (ref 70–99)
Glucose-Capillary: 203 mg/dL — ABNORMAL HIGH (ref 70–99)

## 2019-12-17 NOTE — ED Notes (Signed)
Ptar called pt is number 8 on the list

## 2019-12-17 NOTE — Discharge Summary (Signed)
Physician Discharge Summary  [name] BMW:[id number] DOB: November 07, 1919 DOA: 12/16/2019  PCP: Prince Solian, MD  Admit date: 12/16/2019 Discharge date: 12/17/2019  Admitted From: SNF Disposition: [place]  Discharge Condition: Guarded CODE STATUS: DNR Diet recommendation: Regular diet as tolerated  Brief/Interim Summary: Faith Bolton a 86 y.o.femalewith medical history significant foressential hypertension, recent fall at home that resulted in left ankle fracture,subdural and subarachnoid hemorrhage over the posterior left hemisphere.Admitted on 12/05/2019 and discharged on 12/10/2019 to SNF.She noticed weakness ofher right arm about 2 days ago which has been gradually worsening. Unable to squeeze or make a fist fromher right hand. Difficulty raising her right arm against gravity. No pain. Sensation is intact. In addition,she reports intermittent nonproductive cough. No chest pain or palpitations. Per her daughter at bedside she was recently told by her primary care provider Dr. Balinda Quails she may have a lesion on her left breast thatisconcerning for malignancy. Patient haddeclined further work-up due to her age. She presented to the ED for further evaluation and management.[facility] asked to admit.Lab studies remarkable for serum sodium 125, with baseline of 126. Serum potassium 5.0 and total bilirubin 1.4. CT head showed continued improvement of left-sided subarachnoid hemorrhage and subdural hematoma compatible with prior hemorrhage. No new areas of intracranial infarct or hemorrhage. 1 cm lytic lesion left parietal bone suspicious for metastatic disease or myeloma. MRI brain showing negative for acute infarct. Subacute subdural hemorrhage on the left subacute subarachnoid hemorrhage on the left. Bone marrow lesions in the C2 vertebral body and left parietal bone compatible with metastatic disease.  Patient admitted as above with acute weakness and mental  status changes concerning for stroke however on imaging patient noted to have mass, subsequent imaging remarkable for what appears to be metastatic disease.  Lengthy discussion with palliative care and patient today that she is a poor prognosis and would likely not benefit from any surgical or radiation therapies and we discussed palliative care and hospice.  Patient was very agreeable for this as she would like to focus on comfort and quality of life.  Family also contacted by palliative care and was in agreement.  Patient currently accepted to [facility] awaiting transport otherwise stable and agreeable for discharge to their facility.  Discharge Diagnoses:  Active Problems:   Right arm weakness    Discharge Instructions  Discharge Instructions    Diet general   Complete by: As directed    Regular diet as tolerated   Increase activity slowly   Complete by: As directed      Allergies as of 12/17/2019      Reactions   Ciprofloxacin Other (See Comments)   Pt does not remember    Clindamycin/lincomycin Diarrhea   Diphenoxylate-atropine Other (See Comments)   Can't remember   Metronidazole Other (See Comments)   Can't remember      Medication List    TAKE these medications   acetaminophen 325 MG tablet Commonly known as: TYLENOL Take 650 mg by mouth every 6 (six) hours as needed for mild pain.   Benicar 20 MG tablet Generic drug: olmesartan Take 20 mg by mouth daily.   cefdinir 300 MG capsule Commonly known as: OMNICEF Take 300 mg by mouth 2 (two) times daily.   CENTRUM SILVER ADULT 50+ PO Take 1 tablet by mouth daily.   docusate sodium 100 MG capsule Commonly known as: COLACE Take 100 mg by mouth daily as needed for mild constipation.   glucosamine-chondroitin 500-400 MG tablet Take 1 tablet by  mouth daily.   loratadine 10 MG tablet Commonly known as: CLARITIN Take 10 mg by mouth daily as needed for allergies.   omeprazole 20 MG capsule Commonly known as:  PRILOSEC Take 20 mg by mouth daily.   propranolol 10 MG tablet Commonly known as: INDERAL Take 10 mg by mouth 2 (two) times daily.   Systane Ultra 0.4-0.3 % Soln Generic drug: Polyethyl Glycol-Propyl Glycol Place 1 drop into both eyes 2 (two) times daily as needed (dry eyes).   traMADol 50 MG tablet Commonly known as: ULTRAM Take 0.5 tablets (25 mg total) by mouth 2 (two) times daily as needed for up to 14 days for severe pain. What changed: when to take this   VIACTIV PO Take 650 mg by mouth daily.       Allergies  Allergen Reactions  . Ciprofloxacin Other (See Comments)    Pt does not remember   . Clindamycin/Lincomycin Diarrhea  . Diphenoxylate-Atropine Other (See Comments)    Can't remember  . Metronidazole Other (See Comments)    Can't remember    Consultations:  Palliative care, neurology   Procedures/Studies: EEG  Result Date: 12/17/2019 Lora Havens, MD     12/17/2019 10:17 AM Patient Name: Faith Bolton MRN: [id number] Epilepsy Attending: Lora Havens Referring Physician/Provider: Dr. Harrell Gave Tegeler Date: 12/16/2019 Duration: 23.41 minutes Patient history: 86 year old female with history of subdural/subarachnoid hemorrhage after a fall who presented with worsening right-sided weakness. EEG to evaluate for seizures. Level of alertness: Awake AEDs during EEG study: None Technical aspects: This EEG study was done with scalp electrodes positioned according to the 10-20 International system of electrode placement. Electrical activity was acquired at a sampling rate of 500Hz  and reviewed with a high frequency filter of 70Hz  and a low frequency filter of 1Hz . EEG data were recorded continuously and digitally stored. Description: The posterior dominant rhythm consists of 8 Hz activity of moderate voltage (25-35 uV) seen predominantly in posterior head regions, symmetric and reactive to eye opening and eye closing. EEG showed continuous generalized and  lateralized left hemisphere 3 to 6 Hz theta-delta slowing.  Hyperventilation and photic stimulation were not performed.   ABNORMALITY -Continuous slow, generalized and lateralized left hemisphere IMPRESSION: This study is suggestive of cortical dysfunction in left hemisphere consistent with underlying structural abnormality/bleed as well as mild diffuse encephalopathy, nonspecific etiology but could be secondary to postictal state. No seizures or epileptiform discharges were seen throughout the recording. Lora Havens   DG Chest 2 View  Result Date: 12/05/2019 CLINICAL DATA:  86 year old post fall walking to the bathroom today. EXAM: CHEST - 2 VIEW COMPARISON:  Remote radiograph 01/28/2007 FINDINGS: Lung volumes are low. Upper normal heart size. Mild aortic tortuosity. Aortic atherosclerosis. Subsegmental opacities in the lung bases typical of atelectasis. Possible small pleural effusions. No pneumothorax or confluent airspace disease. No acute osseous abnormalities are seen. Scoliotic curvature of the upper lumbar spine. IMPRESSION: Low lung volumes with bibasilar atelectasis and possible small pleural effusions. Aortic Atherosclerosis (ICD10-I70.0). Electronically Signed   By: Keith Rake M.D.   On: 12/05/2019 19:40   DG Pelvis 1-2 Views  Result Date: 12/05/2019 CLINICAL DATA:  [place] while going to bathroom EXAM: PELVIS - 1-2 VIEW COMPARISON:  None. FINDINGS: SI joints are non widened. Pubic symphysis and rami appear intact. No fracture or malalignment. Mild to moderate degenerative change of the hips. IMPRESSION: No acute osseous abnormality. Electronically Signed   By: Donavan Foil M.D.   On: 12/05/2019 19:39  DG Ankle Complete Left  Result Date: 12/05/2019 CLINICAL DATA:  Fall.  Left foot and ankle pain.  Ankle swelling. EXAM: LEFT ANKLE COMPLETE - 3+ VIEW COMPARISON:  None. FINDINGS: Three views study shows an oblique fracture of the distal fibula, proximal to the tibiotalar joint.  Associated fracture of the medial malleolus evident. Lateral film shows no definite posterior lip fracture of the distal tibia. No evidence for subluxation. Bones are diffusely demineralized. Overlying soft tissue swelling evident. IMPRESSION: Bimalleolar ankle fracture without gross subluxation. Electronically Signed   By: Misty Stanley M.D.   On: 12/05/2019 19:40   CT Head Wo Contrast  Result Date: 12/16/2019 CLINICAL DATA:  Acute neuro deficit.  Rule out stroke. EXAM: CT HEAD WITHOUT CONTRAST TECHNIQUE: Contiguous axial images were obtained from the base of the skull through the vertex without intravenous contrast. COMPARISON:  CT head 12/07/2019 FINDINGS: Brain: Small amount of residual subarachnoid hemorrhage over the left convexity improved on the prior study. Improving left convexity subdural hematoma which is now smaller and lower density. Interhemispheric subdural hematoma has resolved. Small left tentorial subdural hematoma has improved. No new area of hemorrhage or infarction. No mass lesion. There is atrophy and chronic ischemic change in the white matter bilaterally. Vascular: Negative for hyperdense vessel Skull: 1 cm lytic lesion left parietal bone with thinning of the inner table. This is unchanged from recent studies but not present in 2008. No other skull lesion. Sinuses/Orbits: Extensive mucosal edema right maxillary sinus now with air-fluid level. Calcified secretions in the right maxillary sinus extending into the nasal cavity. Bony thickening of the right maxillary sinus. Remaining sinuses clear. Bilateral cataract extraction Other: None IMPRESSION: Continued improvement of left-sided subarachnoid hemorrhage and subdural hematoma compatible with prior hemorrhage. No new area of intracranial infarct or hemorrhage. 1 cm lytic lesion left parietal bone suspicious for metastatic disease or myeloma. Electronically Signed   By: Franchot Gallo M.D.   On: 12/16/2019 13:44   CT HEAD WO  CONTRAST  Result Date: 12/07/2019 CLINICAL DATA:  Follow-up subarachnoid hemorrhage EXAM: CT HEAD WITHOUT CONTRAST TECHNIQUE: Contiguous axial images were obtained from the base of the skull through the vertex without intravenous contrast. COMPARISON:  12/06/2019 FINDINGS: Brain: Unchanged subdural hematoma about the left hemispheric vertex measuring up to 7 mm in thickness (series 5, image 42). Unchanged minimal subdural hemorrhage about the left aspect of the falx (series 4, image 22). Unchanged foci of subarachnoid hemorrhage within the sulci about the left parietal vertex (series 4, image 24). No significant mass effect midline shift. Periventricular and deep white matter hypodensity with mild global volume loss. No evidence of acute infarction or hydrocephalus. Vascular: No hyperdense vessel or unexpected calcification. Skull: Normal. Negative for fracture or focal lesion. Sinuses/Orbits: Total opacification of the partially imaged right maxillary sinus with a calcified lesion at the antrum (series 3, image 5). Other: None. IMPRESSION: 1. Unchanged subdural hematoma about the left hemispheric vertex measuring up to 7 mm in thickness. Unchanged minimal subdural hemorrhage about the left aspect of the falx. 2. Unchanged foci of subarachnoid hemorrhage within the sulci about the left parietal vertex. 3. No significant mass effect or midline shift. 4. Small-vessel white matter disease and mild global volume loss in keeping with advanced patient age. 5. Total opacification of the partially imaged right maxillary sinus with a calcified lesion at the antrum. Electronically Signed   By: Eddie Candle M.D.   On: 12/07/2019 14:47   CT HEAD WO CONTRAST  Result Date: 12/06/2019 CLINICAL  DATA:  Follow-up subarachnoid hemorrhage EXAM: CT HEAD WITHOUT CONTRAST TECHNIQUE: Contiguous axial images were obtained from the base of the skull through the vertex without intravenous contrast. COMPARISON:  Yesterday FINDINGS:  Brain: Subdural hematoma along the left frontal parietal convexity with mild increase from before. Maximal thickness is measured in the parietal region at 7 mm. Interhemispheric subdural continuation is now seen. There is patchy subarachnoid hemorrhage greatest at the left vertex, mildly increased from before. No brain swelling or infarct seen. No hydrocephalus. Generalized brain atrophy Vascular: Atherosclerotic calcification Skull: No acute finding Sinuses/Orbits: Calcified mass at the right nasal cavity and medial right maxillary sinus with chronic right maxillary sinusitis. IMPRESSION: 1. Left convexity subdural hematoma with increase from yesterday, now up to 7 mm in thickness along the parietal lobe. 2. Mild increase in patchy subarachnoid hemorrhage at the left vertex. 3. No midline shift. 4. Chronic calcified mass obstructing the right maxillary sinus. Electronically Signed   By: Monte Fantasia M.D.   On: 12/06/2019 07:44   CT Head Wo Contrast  Result Date: 12/05/2019 CLINICAL DATA:  Fall EXAM: CT HEAD WITHOUT CONTRAST CT CERVICAL SPINE WITHOUT CONTRAST TECHNIQUE: Multidetector CT imaging of the head and cervical spine was performed following the standard protocol without intravenous contrast. Multiplanar CT image reconstructions of the cervical spine were also generated. COMPARISON:  None. FINDINGS: CT HEAD FINDINGS Brain: There is mixed subdural and subarachnoid hemorrhage over the posterior left hemisphere. There is no associated mass effect. Subdural component measures 6 mm. There is generalized atrophy without lobar predilection. There is hypoattenuation of the periventricular white matter, most commonly indicating chronic ischemic microangiopathy. Vascular: No abnormal hyperdensity of the major intracranial arteries or dural venous sinuses. No intracranial atherosclerosis. Skull: The visualized skull base, calvarium and extracranial soft tissues are normal. Sinuses/Orbits: Chronic right maxillary  sinusitis with possible antrochoanal polyp. The orbits are normal. CT CERVICAL SPINE FINDINGS Alignment: No static subluxation. Facets are aligned. Occipital condyles are normally positioned. Skull base and vertebrae: No acute fracture. Soft tissues and spinal canal: No prevertebral fluid or swelling. No visible canal hematoma. Disc levels: Multilevel degenerative disc disease and facet arthrosis. No spinal canal stenosis. There is severe left C7 foraminal stenosis. Upper chest: Right pleural effusion.  4 mm left apical nodule. Other: Calcific aortic atherosclerosis. IMPRESSION: 1. Mixed subdural and subarachnoid hemorrhage over the posterior left hemisphere without associated mass effect. 2. No acute fracture or static subluxation of the cervical spine. 3. Right pleural effusion. Aortic Atherosclerosis (ICD10-I70.0). Critical Value/emergent results were called by telephone at the time of interpretation on 12/05/2019 at 7:42 pm to provider JULIE HAVILAND , who verbally acknowledged these results. Electronically Signed   By: Ulyses Jarred M.D.   On: 12/05/2019 19:43   CT Cervical Spine Wo Contrast  Result Date: 12/05/2019 CLINICAL DATA:  Fall EXAM: CT HEAD WITHOUT CONTRAST CT CERVICAL SPINE WITHOUT CONTRAST TECHNIQUE: Multidetector CT imaging of the head and cervical spine was performed following the standard protocol without intravenous contrast. Multiplanar CT image reconstructions of the cervical spine were also generated. COMPARISON:  None. FINDINGS: CT HEAD FINDINGS Brain: There is mixed subdural and subarachnoid hemorrhage over the posterior left hemisphere. There is no associated mass effect. Subdural component measures 6 mm. There is generalized atrophy without lobar predilection. There is hypoattenuation of the periventricular white matter, most commonly indicating chronic ischemic microangiopathy. Vascular: No abnormal hyperdensity of the major intracranial arteries or dural venous sinuses. No  intracranial atherosclerosis. Skull: The visualized skull base, calvarium and extracranial  soft tissues are normal. Sinuses/Orbits: Chronic right maxillary sinusitis with possible antrochoanal polyp. The orbits are normal. CT CERVICAL SPINE FINDINGS Alignment: No static subluxation. Facets are aligned. Occipital condyles are normally positioned. Skull base and vertebrae: No acute fracture. Soft tissues and spinal canal: No prevertebral fluid or swelling. No visible canal hematoma. Disc levels: Multilevel degenerative disc disease and facet arthrosis. No spinal canal stenosis. There is severe left C7 foraminal stenosis. Upper chest: Right pleural effusion.  4 mm left apical nodule. Other: Calcific aortic atherosclerosis. IMPRESSION: 1. Mixed subdural and subarachnoid hemorrhage over the posterior left hemisphere without associated mass effect. 2. No acute fracture or static subluxation of the cervical spine. 3. Right pleural effusion. Aortic Atherosclerosis (ICD10-I70.0). Critical Value/emergent results were called by telephone at the time of interpretation on 12/05/2019 at 7:42 pm to provider JULIE HAVILAND , who verbally acknowledged these results. Electronically Signed   By: Ulyses Jarred M.D.   On: 12/05/2019 19:43   MR BRAIN WO CONTRAST  Result Date: 12/16/2019 CLINICAL DATA:  Acute neuro deficit. Worsening right arm and leg weakness. History of recent intracranial hemorrhage. EXAM: MRI HEAD WITHOUT CONTRAST TECHNIQUE: Multiplanar, multiecho pulse sequences of the brain and surrounding structures were obtained without intravenous contrast. COMPARISON:  CT head 12/16/2019.  12/07/2019 FINDINGS: Brain: Negative for acute infarct. Chronic microvascular ischemic changes in the white matter with generalized atrophy Subacute subarachnoid hemorrhage on the left. Subacute left subdural hemorrhage. These have improved from prior CT studies. No intracranial mass lesion. No hydrocephalus or midline shift. Vascular:  Normal arterial flow voids. Skull and upper cervical spine: 12 mm bone marrow lesion in the C2 vertebral body. 12 mm lesion left parietal bone. These are concerning for metastatic disease. Sinuses/Orbits: Extensive mucosal edema right maxillary sinus. Bilateral cataract extraction. Other: None IMPRESSION: Negative for acute infarct. Subacute subdural hemorrhage on the left. Subacute subarachnoid hemorrhage on the left. Bone marrow lesions in the C2 vertebral body and left parietal bone compatible with metastatic disease. Electronically Signed   By: Franchot Gallo M.D.   On: 12/16/2019 15:40   MR Cervical Spine Wo Contrast  Result Date: 12/16/2019 CLINICAL DATA:  Myelopathy with mid back pain EXAM: MRI CERVICAL AND THORACIC SPINE WITHOUT CONTRAST TECHNIQUE: Multiplanar and multiecho pulse sequences of the cervical spine, to include the craniocervical junction and cervicothoracic junction, and the thoracic spine, were obtained without intravenous contrast. COMPARISON:  None. FINDINGS: MRI CERVICAL SPINE FINDINGS Alignment: Grade 1 retrolisthesis at C5-6 and C6-7. Grade 1 anterolisthesis at C7-T1. Vertebrae: Numerous bone marrow lesions throughout all vertebral levels. No compression fracture. Cord: Normal signal Posterior Fossa, vertebral arteries, paraspinal tissues: Normal Disc levels: C1-C2: Moderate degenerative change without spinal canal stenosis C2-C3: Mild facet hypertrophy without spinal canal or neural foraminal stenosis. C3-C4: Mild facet degeneration without spinal canal stenosis. No disc herniation. Small left uncovertebral osteophyte contributes to mild left foraminal stenosis. C4-C5: Small disc bulge with bilateral uncovertebral hypertrophy. No spinal canal stenosis. Severe left foraminal stenosis. C5-C6: Disc space narrowing with mild bulge. Mild left foraminal stenosis. No spinal canal stenosis C6-C7: Small disc bulge with endplate spurring and uncovertebral hypertrophy. No central spinal canal  stenosis. Severe left foraminal stenosis. C7-T1: Normal disc space and facets. No spinal canal or neuroforaminal stenosis. MRI THORACIC SPINE FINDINGS Alignment:  Dextroscoliosis with apex at T10 Vertebrae: Innumerable lesions throughout the bone marrow at all levels. No compression fracture. Cord:  Normal signal Paraspinal and other soft tissues: Small right pleural effusion. Disc levels: T6-7: Right uncovertebral osteophyte causes  severe right foraminal stenosis. Abnormal marrow signal at both levels and extending into this osteophyte likely indicates tumor involvement. Mild degenerative changes at the other thoracic levels but no spinal canal stenosis. No soft tissue mass within the epidural space is visible on this examination without IV contrast agent. IMPRESSION: 1. Diffuse osseous metastatic disease throughout the cervical and thoracic spine without pathologic fracture. 2. Cervical degenerative disc disease with severe left C4-5, left C5-6 and left C6-7 neural foraminal stenosis. 3. No thoracic spinal canal stenosis. 4. Small right pleural effusion. 5. Severe right T6 foraminal stenosis due to right-sided osteophyte with abnormal marrow signal, indicating metastatic involvement. Electronically Signed   By: Ulyses Jarred M.D.   On: 12/16/2019 19:25   MR THORACIC SPINE WO CONTRAST  Result Date: 12/16/2019 CLINICAL DATA:  Myelopathy with mid back pain EXAM: MRI CERVICAL AND THORACIC SPINE WITHOUT CONTRAST TECHNIQUE: Multiplanar and multiecho pulse sequences of the cervical spine, to include the craniocervical junction and cervicothoracic junction, and the thoracic spine, were obtained without intravenous contrast. COMPARISON:  None. FINDINGS: MRI CERVICAL SPINE FINDINGS Alignment: Grade 1 retrolisthesis at C5-6 and C6-7. Grade 1 anterolisthesis at C7-T1. Vertebrae: Numerous bone marrow lesions throughout all vertebral levels. No compression fracture. Cord: Normal signal Posterior Fossa, vertebral arteries,  paraspinal tissues: Normal Disc levels: C1-C2: Moderate degenerative change without spinal canal stenosis C2-C3: Mild facet hypertrophy without spinal canal or neural foraminal stenosis. C3-C4: Mild facet degeneration without spinal canal stenosis. No disc herniation. Small left uncovertebral osteophyte contributes to mild left foraminal stenosis. C4-C5: Small disc bulge with bilateral uncovertebral hypertrophy. No spinal canal stenosis. Severe left foraminal stenosis. C5-C6: Disc space narrowing with mild bulge. Mild left foraminal stenosis. No spinal canal stenosis C6-C7: Small disc bulge with endplate spurring and uncovertebral hypertrophy. No central spinal canal stenosis. Severe left foraminal stenosis. C7-T1: Normal disc space and facets. No spinal canal or neuroforaminal stenosis. MRI THORACIC SPINE FINDINGS Alignment:  Dextroscoliosis with apex at T10 Vertebrae: Innumerable lesions throughout the bone marrow at all levels. No compression fracture. Cord:  Normal signal Paraspinal and other soft tissues: Small right pleural effusion. Disc levels: T6-7: Right uncovertebral osteophyte causes severe right foraminal stenosis. Abnormal marrow signal at both levels and extending into this osteophyte likely indicates tumor involvement. Mild degenerative changes at the other thoracic levels but no spinal canal stenosis. No soft tissue mass within the epidural space is visible on this examination without IV contrast agent. IMPRESSION: 1. Diffuse osseous metastatic disease throughout the cervical and thoracic spine without pathologic fracture. 2. Cervical degenerative disc disease with severe left C4-5, left C5-6 and left C6-7 neural foraminal stenosis. 3. No thoracic spinal canal stenosis. 4. Small right pleural effusion. 5. Severe right T6 foraminal stenosis due to right-sided osteophyte with abnormal marrow signal, indicating metastatic involvement. Electronically Signed   By: Ulyses Jarred M.D.   On: 12/16/2019  19:25   DG Chest Portable 1 View  Result Date: 12/16/2019 CLINICAL DATA:  Increased right-sided weakness, cough EXAM: PORTABLE CHEST 1 VIEW COMPARISON:  None. FINDINGS: Blunting of the right costophrenic angle, which may reflect persistent trace pleural effusion or pleural thickening. Minimal atelectasis/scarring at the right lung base. No pneumothorax. Stable cardiomediastinal contours with normal heart size. IMPRESSION: Minimal atelectasis/scarring at the right lung base. Distant trace right pleural effusion or pleural thickening. Electronically Signed   By: Macy Mis M.D.   On: 12/16/2019 14:17   ECHOCARDIOGRAM COMPLETE  Result Date: 12/06/2019    ECHOCARDIOGRAM REPORT   Patient Name:  Marcheta Serafina Mitchell Date of Exam: 12/06/2019 Medical Rec #:  [id number]       Height:       64.0 in Accession #:    [id number]      Weight:       165.0 lb Date of Birth:  [birth year]       BSA:          1.803 m Patient Age:    42 years        BP:           159/66 mmHg Patient Gender: F               HR:           87 bpm. Exam Location:  Inpatient Procedure: 2D Echo, Cardiac Doppler and Color Doppler Indications:    Elevated troponin  History:        Patient has no prior history of Echocardiogram examinations.                 Risk Factors:Hypertension.  Sonographer:    Clayton Lefort RDCS (AE) Referring Phys: [id number] Suann Larry [facility]  Sonographer Comments: Suboptimal subcostal window and Technically challenging study due to limited acoustic windows. Unable to roll patient on left side due to broken ankle. IMPRESSIONS  1. Left ventricular ejection fraction, by estimation, is 60 to 65%. The left ventricle has normal function. The left ventricle has no regional wall motion abnormalities. There is severe left ventricular hypertrophy. Left ventricular diastolic parameters  are consistent with Grade I diastolic dysfunction (impaired relaxation). Elevated left atrial pressure.  2. Right ventricular systolic function is normal. The  right ventricular size is normal. There is mildly elevated pulmonary artery systolic pressure.  3. The mitral valve is normal in structure. Trivial mitral valve regurgitation. No evidence of mitral stenosis.  4. The aortic valve is tricuspid. Aortic valve regurgitation is not visualized. No aortic stenosis is present.  5. The inferior vena cava is normal in size with greater than 50% respiratory variability, suggesting right atrial pressure of 3 mmHg. FINDINGS  Left Ventricle: Left ventricular ejection fraction, by estimation, is 60 to 65%. The left ventricle has normal function. The left ventricle has no regional wall motion abnormalities. The left ventricular internal cavity size was normal in size. There is  severe left ventricular hypertrophy. Left ventricular diastolic parameters are consistent with Grade I diastolic dysfunction (impaired relaxation). Elevated left atrial pressure. Right Ventricle: The right ventricular size is normal.Right ventricular systolic function is normal. There is mildly elevated pulmonary artery systolic pressure. The tricuspid regurgitant velocity is 2.96 m/s, and with an assumed right atrial pressure of  3 mmHg, the estimated right ventricular systolic pressure is 93.8 mmHg. Left Atrium: Left atrial size was normal in size. Right Atrium: Right atrial size was normal in size. Pericardium: There is no evidence of pericardial effusion. Mitral Valve: The mitral valve is normal in structure. Mild mitral annular calcification. Trivial mitral valve regurgitation. No evidence of mitral valve stenosis. MV peak gradient, 10.5 mmHg. The mean mitral valve gradient is 3.0 mmHg. Tricuspid Valve: The tricuspid valve is normal in structure. Tricuspid valve regurgitation is trivial. No evidence of tricuspid stenosis. Aortic Valve: The aortic valve is tricuspid. Aortic valve regurgitation is not visualized. No aortic stenosis is present. Aortic valve mean gradient measures 4.0 mmHg. Aortic valve peak  gradient measures 8.0 mmHg. Aortic valve area, by VTI measures 2.52 cm. Pulmonic Valve: The pulmonic valve was not well visualized. Pulmonic valve regurgitation  is not visualized. No evidence of pulmonic stenosis. Aorta: The aortic root is normal in size and structure. Venous: The inferior vena cava is normal in size with greater than 50% respiratory variability, suggesting right atrial pressure of 3 mmHg. IAS/Shunts: No atrial level shunt detected by color flow Doppler.  LEFT VENTRICLE PLAX 2D LVIDd:         2.90 cm  Diastology LVIDs:         2.30 cm  LV e' medial:    3.26 cm/s LV PW:         1.60 cm  LV E/e' medial:  22.1 LV IVS:        1.90 cm  LV e' lateral:   4.13 cm/s LVOT diam:     1.90 cm  LV E/e' lateral: 17.4 LV SV:         58 LV SV Index:   32 LVOT Area:     2.84 cm  RIGHT VENTRICLE             IVC RV S prime:     14.00 cm/s  IVC diam: 1.40 cm TAPSE (M-mode): 1.5 cm LEFT ATRIUM             Index       RIGHT ATRIUM           Index LA diam:        2.10 cm 1.16 cm/m  RA Area:     12.30 cm LA Vol (A2C):   17.2 ml 9.54 ml/m  RA Volume:   25.10 ml  13.92 ml/m LA Vol (A4C):   28.1 ml 15.59 ml/m LA Biplane Vol: 22.8 ml 12.65 ml/m  AORTIC VALVE AV Area (Vmax):    2.57 cm AV Area (Vmean):   2.54 cm AV Area (VTI):     2.52 cm AV Vmax:           141.00 cm/s AV Vmean:          96.600 cm/s AV VTI:            0.231 m AV Peak Grad:      8.0 mmHg AV Mean Grad:      4.0 mmHg LVOT Vmax:         128.00 cm/s LVOT Vmean:        86.400 cm/s LVOT VTI:          0.205 m LVOT/AV VTI ratio: 0.89  AORTA Ao Root diam: 3.50 cm Ao Asc diam:  3.60 cm MITRAL VALVE                TRICUSPID VALVE MV Area (PHT): 2.17 cm     TR Peak grad:   35.0 mmHg MV Peak grad:  10.5 mmHg    TR Vmax:        296.00 cm/s MV Mean grad:  3.0 mmHg MV Vmax:       1.62 m/s     SHUNTS MV Vmean:      84.3 cm/s    Systemic VTI:  0.20 m MV Decel Time: 349 msec     Systemic Diam: 1.90 cm MV E velocity: 72.00 cm/s MV A velocity: 125.00 cm/s MV E/A ratio:   0.58 Kirk Ruths MD Electronically signed by Kirk Ruths MD Signature Date/Time: 12/06/2019/2:45:54 PM    Final      Subjective: No acute issues or events overnight, feels quite well denies nausea, vomiting, diarrhea, constipation, headache, fevers, chills.   Discharge Exam: Vitals:   12/17/19  1000 12/17/19 1415  BP: 119/63 120/71  Pulse: 85 80  Resp: (!) 23 18  Temp:  98.5 F (36.9 C)  SpO2: 93% 97%   Vitals:   12/17/19 0700 12/17/19 0715 12/17/19 1000 12/17/19 1415  BP: (!) 172/93  119/63 120/71  Pulse: 90 82 85 80  Resp: 20 (!) 32 (!) 23 18  Temp:    98.5 F (36.9 C)  TempSrc:    Oral  SpO2: 99% 95% 93% 97%    General: Pt is alert, awake, not in acute distress Cardiovascular: RRR, S1/S2 +, no rubs, no gallops Respiratory: CTA bilaterally, no wheezing, no rhonchi Abdominal: Soft, NT, ND, bowel sounds + Extremities: no edema, no cyanosis    The results of significant diagnostics from this hospitalization (including imaging, microbiology, ancillary and laboratory) are listed below for reference.     Microbiology: Recent Results (from the past 240 hour(s))  Culture, blood (routine x 2)     Status: None   Collection Time: 12/07/19  4:32 PM   Specimen: BLOOD  Result Value Ref Range Status   Specimen Description BLOOD LEFT ANTECUBITAL  Final   Special Requests   Final    BOTTLES DRAWN AEROBIC AND ANAEROBIC Blood Culture results may not be optimal due to an inadequate volume of blood received in culture bottles   Culture   Final    NO GROWTH 5 DAYS Performed at [facility], [place] [address] [place], [place] [address]    Report Status 12/12/2019 FINAL  Final  Culture, blood (routine x 2)     Status: None   Collection Time: 12/07/19  4:32 PM   Specimen: BLOOD  Result Value Ref Range Status   Specimen Description BLOOD LEFT ANTECUBITAL  Final   Special Requests   Final    BOTTLES DRAWN AEROBIC ONLY Blood Culture results may not be optimal due to an  inadequate volume of blood received in culture bottles   Culture   Final    NO GROWTH 5 DAYS Performed at [facility], [place] [address] [place], [place] [address]    Report Status 12/12/2019 FINAL  Final  Respiratory Panel by RT PCR (Flu A&B, Covid) - Nasopharyngeal Swab     Status: None   Collection Time: 12/10/19 10:00 AM   Specimen: Nasopharyngeal Swab  Result Value Ref Range Status   SARS Coronavirus 2 by RT PCR NEGATIVE NEGATIVE Final    Comment: (NOTE) SARS-CoV-2 target nucleic acids are NOT DETECTED.  The SARS-CoV-2 RNA is generally detectable in upper respiratoy specimens during the acute phase of infection. The lowest concentration of SARS-CoV-2 viral copies this assay can detect is 131 copies/mL. A negative result does not preclude SARS-Cov-2 infection and should not be used as the sole basis for treatment or other patient management decisions. A negative result may occur with  improper specimen collection/handling, submission of specimen other than nasopharyngeal swab, presence of viral mutation(s) within the areas targeted by this assay, and inadequate number of viral copies (<131 copies/mL). A negative result must be combined with clinical observations, patient history, and epidemiological information. The expected result is Negative.  Fact Sheet for Patients:  PinkCheek.be  Fact Sheet for Healthcare Providers:  GravelBags.it  This test is no t yet approved or cleared by the [place] FDA and  has been authorized for detection and/or diagnosis of SARS-CoV-2 by FDA under an Emergency Use Authorization (EUA). This EUA will remain  in effect (meaning this test can be used)  for the duration of the COVID-19 declaration under Section 564(b)(1) of the Act, 21 U.S.C. section 360bbb-3(b)(1), unless the authorization is terminated or revoked sooner.     Influenza A by PCR NEGATIVE NEGATIVE Final    Influenza B by PCR NEGATIVE NEGATIVE Final    Comment: (NOTE) The Xpert Xpress SARS-CoV-2/FLU/RSV assay is intended as an aid in  the diagnosis of influenza from Nasopharyngeal swab specimens and  should not be used as a sole basis for treatment. Nasal washings and  aspirates are unacceptable for Xpert Xpress SARS-CoV-2/FLU/RSV  testing.  Fact Sheet for Patients: PinkCheek.be  Fact Sheet for Healthcare Providers: GravelBags.it  This test is not yet approved or cleared by the [place] FDA and  has been authorized for detection and/or diagnosis of SARS-CoV-2 by  FDA under an Emergency Use Authorization (EUA). This EUA will remain  in effect (meaning this test can be used) for the duration of the  Covid-19 declaration under Section 564(b)(1) of the Act, 21  U.S.C. section 360bbb-3(b)(1), unless the authorization is  terminated or revoked. Performed at [facility], [place] [address] [place], [place] [address]   Respiratory Panel by RT PCR (Flu A&B, Covid) - Nasopharyngeal Swab     Status: None   Collection Time: 12/16/19  2:23 PM   Specimen: Nasopharyngeal Swab  Result Value Ref Range Status   SARS Coronavirus 2 by RT PCR NEGATIVE NEGATIVE Final    Comment: (NOTE) SARS-CoV-2 target nucleic acids are NOT DETECTED.  The SARS-CoV-2 RNA is generally detectable in upper respiratoy specimens during the acute phase of infection. The lowest concentration of SARS-CoV-2 viral copies this assay can detect is 131 copies/mL. A negative result does not preclude SARS-Cov-2 infection and should not be used as the sole basis for treatment or other patient management decisions. A negative result may occur with  improper specimen collection/handling, submission of specimen other than nasopharyngeal swab, presence of viral mutation(s) within the areas targeted by this assay, and inadequate number of viral copies (<131 copies/mL). A  negative result must be combined with clinical observations, patient history, and epidemiological information. The expected result is Negative.  Fact Sheet for Patients:  PinkCheek.be  Fact Sheet for Healthcare Providers:  GravelBags.it  This test is no t yet approved or cleared by the [place] FDA and  has been authorized for detection and/or diagnosis of SARS-CoV-2 by FDA under an Emergency Use Authorization (EUA). This EUA will remain  in effect (meaning this test can be used) for the duration of the COVID-19 declaration under Section 564(b)(1) of the Act, 21 U.S.C. section 360bbb-3(b)(1), unless the authorization is terminated or revoked sooner.     Influenza A by PCR NEGATIVE NEGATIVE Final   Influenza B by PCR NEGATIVE NEGATIVE Final    Comment: (NOTE) The Xpert Xpress SARS-CoV-2/FLU/RSV assay is intended as an aid in  the diagnosis of influenza from Nasopharyngeal swab specimens and  should not be used as a sole basis for treatment. Nasal washings and  aspirates are unacceptable for Xpert Xpress SARS-CoV-2/FLU/RSV  testing.  Fact Sheet for Patients: PinkCheek.be  Fact Sheet for Healthcare Providers: GravelBags.it  This test is not yet approved or cleared by the [place] FDA and  has been authorized for detection and/or diagnosis of SARS-CoV-2 by  FDA under an Emergency Use Authorization (EUA). This EUA will remain  in effect (meaning this test can be used) for the duration of the  Covid-19 declaration under Section 564(b)(1) of the Act, 21  U.S.C. section 360bbb-3(b)(1), unless the authorization is  terminated or revoked. Performed at [facility], [place] [address] [place], [place] [address]      Labs: BNP (last 3 results) No results for input(s): BNP in the last 8760 hours. Basic Metabolic Panel: Recent Labs  Lab 12/16/19 1321  12/17/19 0251  NA 125* 125*  K 5.0 4.6  CL 89* 88*  CO2 25 23  GLUCOSE 137* 191*  BUN 11 12  CREATININE 0.69 0.68  CALCIUM 9.8 10.0   Liver Function Tests: Recent Labs  Lab 12/16/19 1321 12/17/19 0251  AST 39 32  ALT 19 19  ALKPHOS 103 104  BILITOT 1.4* 0.8  PROT 6.2* 6.4*  ALBUMIN 2.6* 2.7*   No results for input(s): LIPASE, AMYLASE in the last 168 hours. No results for input(s): AMMONIA in the last 168 hours. CBC: Recent Labs  Lab 12/16/19 1321 12/17/19 0251  WBC 10.2 10.2  NEUTROABS 7.7 8.9*  HGB 12.6 12.6  HCT 36.8 37.4  MCV 91.8 91.4  PLT 334 356   Cardiac Enzymes: No results for input(s): CKTOTAL, CKMB, CKMBINDEX, TROPONINI in the last 168 hours. BNP: Invalid input(s): POCBNP CBG: Recent Labs  Lab 12/16/19 2142 12/17/19 0825 12/17/19 1153  GLUCAP 169* 176* 203*   D-Dimer No results for input(s): DDIMER in the last 72 hours. Hgb A1c Recent Labs    12/17/19 0251  HGBA1C 7.4*   Lipid Profile No results for input(s): CHOL, HDL, LDLCALC, TRIG, CHOLHDL, LDLDIRECT in the last 72 hours. Thyroid function studies Recent Labs    12/16/19 1344  TSH 1.599   Anemia work up No results for input(s): VITAMINB12, FOLATE, FERRITIN, TIBC, IRON, RETICCTPCT in the last 72 hours. Urinalysis    Component Value Date/Time   COLORURINE YELLOW 12/16/2019 1936   APPEARANCEUR CLEAR 12/16/2019 1936   LABSPEC 1.016 12/16/2019 1936   PHURINE 6.0 12/16/2019 1936   GLUCOSEU NEGATIVE 12/16/2019 [place] NEGATIVE 12/16/2019 [place] 12/16/2019 1936   KETONESUR 20 (A) 12/16/2019 1936   PROTEINUR NEGATIVE 12/16/2019 1936   UROBILINOGEN 0.2 01/28/2007 1741   NITRITE NEGATIVE 12/16/2019 1936   LEUKOCYTESUR SMALL (A) 12/16/2019 1936   Sepsis Labs Invalid input(s): PROCALCITONIN,  WBC,  LACTICIDVEN Microbiology Recent Results (from the past 240 hour(s))  Culture, blood (routine x 2)     Status: None   Collection Time: 12/07/19  4:32 PM    Specimen: BLOOD  Result Value Ref Range Status   Specimen Description BLOOD LEFT ANTECUBITAL  Final   Special Requests   Final    BOTTLES DRAWN AEROBIC AND ANAEROBIC Blood Culture results may not be optimal due to an inadequate volume of blood received in culture bottles   Culture   Final    NO GROWTH 5 DAYS Performed at [facility], [place] [address], [place], [place] [address]    Report Status 12/12/2019 FINAL  Final  Culture, blood (routine x 2)     Status: None   Collection Time: 12/07/19  4:32 PM   Specimen: BLOOD  Result Value Ref Range Status   Specimen Description BLOOD LEFT ANTECUBITAL  Final   Special Requests   Final    BOTTLES DRAWN AEROBIC ONLY Blood Culture results may not be optimal due to an inadequate volume of blood received in culture bottles   Culture   Final    NO GROWTH 5 DAYS Performed at [facility], [place] [address] [place], [place] [address]  Report Status 12/12/2019 FINAL  Final  Respiratory Panel by RT PCR (Flu A&B, Covid) - Nasopharyngeal Swab     Status: None   Collection Time: 12/10/19 10:00 AM   Specimen: Nasopharyngeal Swab  Result Value Ref Range Status   SARS Coronavirus 2 by RT PCR NEGATIVE NEGATIVE Final    Comment: (NOTE) SARS-CoV-2 target nucleic acids are NOT DETECTED.  The SARS-CoV-2 RNA is generally detectable in upper respiratoy specimens during the acute phase of infection. The lowest concentration of SARS-CoV-2 viral copies this assay can detect is 131 copies/mL. A negative result does not preclude SARS-Cov-2 infection and should not be used as the sole basis for treatment or other patient management decisions. A negative result may occur with  improper specimen collection/handling, submission of specimen other than nasopharyngeal swab, presence of viral mutation(s) within the areas targeted by this assay, and inadequate number of viral copies (<131 copies/mL). A negative result must be combined with  clinical observations, patient history, and epidemiological information. The expected result is Negative.  Fact Sheet for Patients:  PinkCheek.be  Fact Sheet for Healthcare Providers:  GravelBags.it  This test is no t yet approved or cleared by the [place] FDA and  has been authorized for detection and/or diagnosis of SARS-CoV-2 by FDA under an Emergency Use Authorization (EUA). This EUA will remain  in effect (meaning this test can be used) for the duration of the COVID-19 declaration under Section 564(b)(1) of the Act, 21 U.S.C. section 360bbb-3(b)(1), unless the authorization is terminated or revoked sooner.     Influenza A by PCR NEGATIVE NEGATIVE Final   Influenza B by PCR NEGATIVE NEGATIVE Final    Comment: (NOTE) The Xpert Xpress SARS-CoV-2/FLU/RSV assay is intended as an aid in  the diagnosis of influenza from Nasopharyngeal swab specimens and  should not be used as a sole basis for treatment. Nasal washings and  aspirates are unacceptable for Xpert Xpress SARS-CoV-2/FLU/RSV  testing.  Fact Sheet for Patients: PinkCheek.be  Fact Sheet for Healthcare Providers: GravelBags.it  This test is not yet approved or cleared by the [place] FDA and  has been authorized for detection and/or diagnosis of SARS-CoV-2 by  FDA under an Emergency Use Authorization (EUA). This EUA will remain  in effect (meaning this test can be used) for the duration of the  Covid-19 declaration under Section 564(b)(1) of the Act, 21  U.S.C. section 360bbb-3(b)(1), unless the authorization is  terminated or revoked. Performed at [facility], [place] [address], [place], [place] [address]   Respiratory Panel by RT PCR (Flu A&B, Covid) - Nasopharyngeal Swab     Status: None   Collection Time: 12/16/19  2:23 PM   Specimen: Nasopharyngeal Swab  Result Value Ref Range Status    SARS Coronavirus 2 by RT PCR NEGATIVE NEGATIVE Final    Comment: (NOTE) SARS-CoV-2 target nucleic acids are NOT DETECTED.  The SARS-CoV-2 RNA is generally detectable in upper respiratoy specimens during the acute phase of infection. The lowest concentration of SARS-CoV-2 viral copies this assay can detect is 131 copies/mL. A negative result does not preclude SARS-Cov-2 infection and should not be used as the sole basis for treatment or other patient management decisions. A negative result may occur with  improper specimen collection/handling, submission of specimen other than nasopharyngeal swab, presence of viral mutation(s) within the areas targeted by this assay, and inadequate number of viral copies (<131 copies/mL). A negative result must be combined with clinical observations, patient history, and epidemiological information.  The expected result is Negative.  Fact Sheet for Patients:  PinkCheek.be  Fact Sheet for Healthcare Providers:  GravelBags.it  This test is no t yet approved or cleared by the [place] FDA and  has been authorized for detection and/or diagnosis of SARS-CoV-2 by FDA under an Emergency Use Authorization (EUA). This EUA will remain  in effect (meaning this test can be used) for the duration of the COVID-19 declaration under Section 564(b)(1) of the Act, 21 U.S.C. section 360bbb-3(b)(1), unless the authorization is terminated or revoked sooner.     Influenza A by PCR NEGATIVE NEGATIVE Final   Influenza B by PCR NEGATIVE NEGATIVE Final    Comment: (NOTE) The Xpert Xpress SARS-CoV-2/FLU/RSV assay is intended as an aid in  the diagnosis of influenza from Nasopharyngeal swab specimens and  should not be used as a sole basis for treatment. Nasal washings and  aspirates are unacceptable for Xpert Xpress SARS-CoV-2/FLU/RSV  testing.  Fact Sheet for  Patients: PinkCheek.be  Fact Sheet for Healthcare Providers: GravelBags.it  This test is not yet approved or cleared by the [place] FDA and  has been authorized for detection and/or diagnosis of SARS-CoV-2 by  FDA under an Emergency Use Authorization (EUA). This EUA will remain  in effect (meaning this test can be used) for the duration of the  Covid-19 declaration under Section 564(b)(1) of the Act, 21  U.S.C. section 360bbb-3(b)(1), unless the authorization is  terminated or revoked. Performed at [facility], [place] [address] [place], [place] [address]      Time coordinating discharge: Over 30 minutes  SIGNED:   Little Ishikawa, DO [facility] 12/17/2019, 3:10 PM Pager   If 7PM-7AM, please contact night-coverage [email or web address]

## 2019-12-17 NOTE — ED Notes (Signed)
Pt transported via [facility] to hospice at this time

## 2019-12-17 NOTE — Consult Note (Signed)
Consultation Note Date: 12/17/2019   Patient Name: Faith Bolton  DOB: 1920-01-26  MRN: [id number]  Age / Sex: 86 y.o., female  PCP: Prince Solian, MD Referring Physician: Little Ishikawa, MD  Reason for Consultation: Establishing goals of care  HPI/Patient Profile: 86 y.o. female  with past medical history of osteoarthritis and HTN presenting to [facility] emergency department on 12/16/2019 with right arm weakness and numbness for 2 days. She was recently hospitalized 12/05/19 after a fall resulting in a left ankle fracture, subarachnoid hemorrhage, and subdural hematoma - she was discharged to [facility] SNF/rehab on 12/10/19. ED Course: CT head shows continued improvement of left-sided subarachnoid hemorrhage and subdural hematoma, no new infarct or hemorrhage. MRI head is negative for acute infarct, shows chronic microvascular ischemic changes in the white matter with generalized atrophy, and 12 mm lesion left parietal bone and 12 mm bone marrow lesion in the C2 vertebral body concerning for metastatic disease.  Primary decision maker: Patient has capacity to make medical decisions for herself, with support from her 2 daughters. Main point of  Contact for family is June Bennett (daughter) [phone]  Clinical Assessment and Goals of Care: I have reviewed medical records including EPIC notes, labs and imaging, examined the patient and met at bedside with daughter/June to discuss diagnosis, prognosis, GOC, EOL wishes, disposition, and options. Daughter/Kathy was present via iPad.   I introduced Palliative Medicine as specialized medical care for people living with serious illness. It focuses on providing relief from the symptoms and stress of a serious illness.   We discussed a brief life review of the patient. She is widowed. She has 2 daughters, June and Juliann Pulse. 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 Her family and her faith are very important to her.   As far as functional and nutritional status, patient was living independently in her home prior to September. Currently however, she is non-ambulatory due to the left ankle fracture and needs assistance with all ADLs. Patient reports she has little to no appetite. June reports her mother's po intake has been minimal over the past few weeks, only "bites and sips".    We discussed her current illness and what it means in the larger context of her ongoing co-morbidities. Patient and family understand that based on the imaging, patient most likely has metastatic cancer. They have declined any work-up or treatment for this.  Natural disease trajectory of cancer was discussed.  I attempted to elicit values and goals of care important to the patient. Patient tells me she is "ready to die". She also states she wants to die with dignity and doesn't want to be a burden on her family.   The difference between aggressive medical intervention and comfort care was considered in light of the patient's goals of care. Patient and family decline any aggressive medical intervention and want to focus on comfort and dignity.   Advanced directives, concepts specific to code status, artifical feeding and hydration, and rehospitalization were considered and  discussed.  Hospice and Palliative Care services outpatient were explained and offered. Patient and family would prefer to transition to hospice care.   Questions and concerns were addressed.  The family was encouraged to call with questions or concerns.    SUMMARY OF RECOMMENDATIONS   - DNR/DNI (present on admission) - Patient and family decline any aggressive medical intervention  - Goal of care is comfort and dignity - Patient and family agree with transition to hospice care, they request [facility] - Patient has minimal po intake (bites/sips) and pain with any movement of her left ankle; now  with new right sided weakness - I have spoken with case management and hospice liaison  Code Status/Advance Care Planning:  DNR  Palliative Prophylaxis:   Frequent Pain Assessment, Oral Care and Turn Reposition   Psycho-social/Spiritual:   Created space and opportunity for patient and family to express thoughts and feelings regarding patient's current medical situation.   Emotional support provided  Prognosis:   weeks  Discharge Planning: To Be Determined      Primary Diagnoses: Present on Admission: . Right arm weakness   I have reviewed the medical record, interviewed the patient and family, and examined the patient. The following aspects are pertinent.  Past Medical History:  Diagnosis Date  . Cancer ([place])   . Hypertension   . Macular degeneration (senile) of retina, unspecified   . Osteoarthrosis, unspecified whether generalized or localized, unspecified site   . Reflux      No family history on file. Scheduled Meds: . dexamethasone (DECADRON) injection  4 mg Intravenous TID  . enoxaparin (LOVENOX) injection  40 mg Subcutaneous Q24H  . insulin aspart  0-5 Units Subcutaneous QHS  . insulin aspart  0-6 Units Subcutaneous TID WC  . irbesartan  75 mg Oral Daily  . multivitamin with minerals  1 tablet Oral Daily  . pantoprazole  40 mg Oral Daily  . propranolol  10 mg Oral BID   Continuous Infusions: . sodium chloride 50 mL/hr at 12/16/19 1908   PRN Meds:.acetaminophen, labetalol  Medications Prior to Admission:  Prior to Admission medications   Medication Sig Start Date End Date Taking? Authorizing Provider  acetaminophen (TYLENOL) 325 MG tablet Take 650 mg by mouth every 6 (six) hours as needed for mild pain.    Yes [provider]  BENICAR 20 MG tablet Take 20 mg by mouth daily.  06/13/13  Yes [provider]  Calcium-Vitamin D-Vitamin K (VIACTIV PO) Take 650 mg by mouth daily.    Yes [provider]  cefdinir (OMNICEF) 300 MG  capsule Take 300 mg by mouth 2 (two) times daily.   Yes [provider]  docusate sodium (COLACE) 100 MG capsule Take 100 mg by mouth daily as needed for mild constipation.   Yes [provider]  glucosamine-chondroitin 500-400 MG tablet Take 1 tablet by mouth daily.    Yes [provider]  loratadine (CLARITIN) 10 MG tablet Take 10 mg by mouth daily as needed for allergies.    Yes [provider]  Multiple Vitamins-Minerals (CENTRUM SILVER ADULT 50+ PO) Take 1 tablet by mouth daily.    Yes [provider]  omeprazole (PRILOSEC) 20 MG capsule Take 20 mg by mouth daily.  06/13/13  Yes [provider]  Polyethyl Glycol-Propyl Glycol (SYSTANE ULTRA) 0.4-0.3 % SOLN Place 1 drop into both eyes 2 (two) times daily as needed (dry eyes).   Yes [provider]  propranolol (INDERAL) 10 MG tablet  Take 10 mg by mouth 2 (two) times daily.  05/16/13  Yes [provider]  traMADol (ULTRAM) 50 MG tablet Take 0.5 tablets (25 mg total) by mouth 2 (two) times daily as needed for up to 14 days for severe pain. Patient taking differently: Take 25 mg by mouth daily as needed for severe pain.  12/13/19 12/27/19 Yes Reed, Tiffany L, DO   Allergies  Allergen Reactions  . Ciprofloxacin Other (See Comments)    Pt does not remember   . Clindamycin/Lincomycin Diarrhea  . Diphenoxylate-Atropine Other (See Comments)    Can't remember  . Metronidazole Other (See Comments)    Can't remember   Review of Systems  Constitutional: Positive for appetite change.  Neurological: Positive for weakness and numbness.    Physical Exam Vitals reviewed.  Constitutional:      General: She is not in acute distress. HENT:     Head: Normocephalic and atraumatic.  Cardiovascular:     Rate and Rhythm: Normal rate and regular rhythm.  Pulmonary:     Effort: Pulmonary effort is normal.  Musculoskeletal:     Comments: Left ankle fracture with splint  Neurological:       Mental Status: She is alert and oriented to person, place, and time.     Comments: Weakness right upper extremity     Vital Signs: BP (!) 172/93   Pulse 82   Temp 97.7 F (36.5 C) (Oral)   Resp (!) 32   SpO2 95%  Pain Scale: 0-10   Pain Score: 0-No pain   SpO2: SpO2: 95 % O2 Device:SpO2: 95 % O2 Flow Rate: .O2 Flow Rate (L/min): 2 L/min     Palliative Assessment/Data: PPS 20%     Time In: 12:40 Time Out: 13:50 Time Total: 70 minutes Greater than 50%  of this time was spent counseling and coordinating care related to the above assessment and plan.  Signed by: Lavena Bullion, NP   Please contact Palliative Medicine Team phone at [phone] for questions and concerns.  For individual provider: See Shea Evans

## 2019-12-17 NOTE — ED Notes (Signed)
Spoke with admitting Dr. Purcell Nails confirmed AVS paperwork ready for printing and pt able to be discharged a this time

## 2019-12-17 NOTE — Progress Notes (Signed)
Manufacturing engineer [facility]) Hospital Liaison note.     Received request from Matilde Haymaker, NP  for family interest in [facility]. Chart reviewed and eligibility confirmed. Spoke with family to confirm interest and explain services. Family agreeable to transfer today.   [facility] will notify ED staff when registration paperwork has been completed to arrange transport.    RN please call report to [phone].   Thank you,     Farrel Gordon, RN, CCM       [facility] (listed on [place] under [facility])     [phone]

## 2019-12-17 NOTE — ED Notes (Signed)
Set patient up to eat breakfast tray patient is resting with call bell in reach

## 2019-12-17 NOTE — Procedures (Signed)
Patient Name: Faith Bolton  MRN: [id number]  Epilepsy Attending: Lora Havens  Referring Physician/Provider: Dr. Marda Stalker Date: 12/16/2019 Duration: 23.41 minutes  Patient history: 86 year old female with history of subdural/subarachnoid hemorrhage after a fall who presented with worsening right-sided weakness. EEG to evaluate for seizures.  Level of alertness: Awake  AEDs during EEG study: None  Technical aspects: This EEG study was done with scalp electrodes positioned according to the 10-20 International system of electrode placement. Electrical activity was acquired at a sampling rate of 500Hz  and reviewed with a high frequency filter of 70Hz  and a low frequency filter of 1Hz . EEG data were recorded continuously and digitally stored.   Description: The posterior dominant rhythm consists of 8 Hz activity of moderate voltage (25-35 uV) seen predominantly in posterior head regions, symmetric and reactive to eye opening and eye closing. EEG showed continuous generalized and lateralized left hemisphere 3 to 6 Hz theta-delta slowing.  Hyperventilation and photic stimulation were not performed.     ABNORMALITY -Continuous slow, generalized and lateralized left hemisphere  IMPRESSION: This study is suggestive of cortical dysfunction in left hemisphere consistent with underlying structural abnormality/bleed as well as mild diffuse encephalopathy, nonspecific etiology but could be secondary to postictal state. No seizures or epileptiform discharges were seen throughout the recording.  Gen Clagg Barbra Sarks

## 2019-12-17 NOTE — ED Notes (Signed)
Report given to [facility].

## 2019-12-17 NOTE — ED Notes (Signed)
Pt had small bowel movement, chucks cleaned, pt repositioned in bed, left leg elevated onto a pillow. Daughter at bedside

## 2019-12-17 NOTE — Evaluation (Signed)
Physical Therapy Evaluation Patient Details Name: Faith Bolton MRN: [id number] DOB: May 03, 1919 Today's Date: 12/17/2019   History of Present Illness  Faith Bolton is a 86 y.o. female with medical history significant for essential hypertension, recent fall at home that resulted in left ankle fracture, subdural and subarachnoid hemorrhage over the posterior left hemisphere.  Admitted on 12/05/2019 and discharged on 12/10/2019 to SNF.  She noticed weakness of her right arm about 2 days ago which has been gradually worsening.  Unable to squeeze or make a fist from her right hand.  Difficulty raising her right arm against gravity. Imaging shows multiple bone marrow lesions worrysome for metastatic disease.  Clinical Impression  Patient received in bed, agreeable to PT assessment. She is slightly confused. Tells me she is here because of her ankle. Reports R UE weakness. She has poor grip on right and is able to flex elbow slightly. 2/5 grossly. She requires min +2 assist for rolling in bed, mod +2 for sit >< supine. She is unable to attempt sit to stand or bed to recliner transfers at this time as she is NWB on her Left LE. She will continue to benefit from skilled PT while here to improve strength and functional independence for return to SNF at discharge.        Follow Up Recommendations SNF    Equipment Recommendations  None recommended by PT;Other (comment) (TBD)    Recommendations for Other Services       Precautions / Restrictions Precautions Precautions: Fall Precaution Comments: 2 falls prior to admission. Recent L ankle fracture, NWB Restrictions Weight Bearing Restrictions: Yes RLE Weight Bearing: Weight bearing as tolerated LLE Weight Bearing: Non weight bearing      Mobility  Bed Mobility Overal bed mobility: Needs Assistance Bed Mobility: Supine to Sit;Sit to Supine;Rolling Rolling: +2 for physical assistance;Min assist   Supine to sit: Mod assist;+2 for physical  assistance Sit to supine: Mod assist;+2 for physical assistance   General bed mobility comments: mod assist to get sitting on side of bed. Once positioned she is able to sit independently.  Transfers                 General transfer comment: not attempted  Ambulation/Gait             General Gait Details: not attempted/unable  Stairs            Wheelchair Mobility    Modified Rankin (Stroke Patients Only)       Balance Overall balance assessment: Needs assistance Sitting-balance support: Bilateral upper extremity supported Sitting balance-Leahy Scale: Good Sitting balance - Comments: able to maintain sitting independently       Standing balance comment: unable to progress into standing                             Pertinent Vitals/Pain Pain Assessment: No/denies pain Pain Intervention(s): Monitored during session    Home Living Family/patient expects to be discharged to:: Skilled nursing facility                      Prior Function Level of Independence: Needs assistance   Gait / Transfers Assistance Needed: has been non-ambulatory since ankle fracture.  ADL's / Homemaking Assistance Needed: Needs assist.        Hand Dominance   Dominant Hand: Right    Extremity/Trunk Assessment   Upper Extremity Assessment Upper Extremity Assessment: RUE  deficits/detail RUE Deficits / Details: limited use of R UE. weak grip, some elbow flexion, 2/5 grossly RUE Sensation: WNL RUE Coordination: decreased gross motor;decreased fine motor    Lower Extremity Assessment Lower Extremity Assessment: LLE deficits/detail LLE Deficits / Details: L ankle splinted LLE Sensation: WNL    Cervical / Trunk Assessment Cervical / Trunk Exceptions: per daughter, does have a hx of compression deformities.    Communication   Communication: HOH  Cognition Arousal/Alertness: Awake/alert Behavior During Therapy: WFL for tasks  assessed/performed Overall Cognitive Status: Within Functional Limits for tasks assessed                               Problem Solving: Slow processing;Requires verbal cues;Requires tactile cues        General Comments      Exercises     Assessment/Plan    PT Assessment Patient needs continued PT services  PT Problem List Decreased strength;Decreased mobility;Decreased safety awareness;Decreased activity tolerance;Decreased balance;Decreased knowledge of precautions       PT Treatment Interventions DME instruction;Functional mobility training;Therapeutic activities;Therapeutic exercise;Balance training;Patient/family education    PT Goals (Current goals can be found in the Care Plan section)  Acute Rehab PT Goals Patient Stated Goal: to improve use of R UE PT Goal Formulation: With patient Time For Goal Achievement: 12/24/19 Potential to Achieve Goals: Fair    Frequency Min 2X/week   Barriers to discharge        Co-evaluation               AM-PAC PT "6 Clicks" Mobility  Outcome Measure Help needed turning from your back to your side while in a flat bed without using bedrails?: A Little Help needed moving from lying on your back to sitting on the side of a flat bed without using bedrails?: A Lot Help needed moving to and from a bed to a chair (including a wheelchair)?: Total Help needed standing up from a chair using your arms (e.g., wheelchair or bedside chair)?: Total Help needed to walk in hospital room?: Total Help needed climbing 3-5 steps with a railing? : Total 6 Click Score: 9    End of Session Equipment Utilized During Treatment: Oxygen Activity Tolerance: Patient tolerated treatment well Patient left: in bed;with call bell/phone within reach Nurse Communication: Mobility status PT Visit Diagnosis: Muscle weakness (generalized) (M62.81);Other abnormalities of gait and mobility (R26.89);Repeated falls (R29.6);Difficulty in walking, not  elsewhere classified (R26.2)    Time: 7616-0737 PT Time Calculation (min) (ACUTE ONLY): 25 min   Charges:   PT Evaluation $PT Eval Moderate Complexity: 1 Mod PT Treatments $Therapeutic Activity: 8-22 mins        Jamison Yuhasz, PT, GCS 12/17/19,12:16 PM

## 2019-12-18 ENCOUNTER — Other Ambulatory Visit: Payer: Self-pay

## 2019-12-18 ENCOUNTER — Non-Acute Institutional Stay: Payer: Self-pay | Admitting: Internal Medicine

## 2019-12-18 DIAGNOSIS — Z7189 Other specified counseling: Secondary | ICD-10-CM | POA: Insufficient documentation

## 2019-12-18 DIAGNOSIS — R531 Weakness: Secondary | ICD-10-CM | POA: Insufficient documentation

## 2019-12-18 DIAGNOSIS — Z8679 Personal history of other diseases of the circulatory system: Secondary | ICD-10-CM | POA: Diagnosis not present

## 2019-12-18 DIAGNOSIS — Z515 Encounter for palliative care: Secondary | ICD-10-CM | POA: Insufficient documentation

## 2019-12-18 DIAGNOSIS — C50919 Malignant neoplasm of unspecified site of unspecified female breast: Secondary | ICD-10-CM | POA: Diagnosis not present

## 2019-12-18 DIAGNOSIS — C7951 Secondary malignant neoplasm of bone: Secondary | ICD-10-CM | POA: Diagnosis not present

## 2019-12-18 DIAGNOSIS — I10 Essential (primary) hypertension: Secondary | ICD-10-CM | POA: Diagnosis not present

## 2019-12-19 ENCOUNTER — Ambulatory Visit (HOSPITAL_COMMUNITY): Payer: Medicare Other

## 2019-12-19 ENCOUNTER — Encounter (HOSPITAL_COMMUNITY): Payer: Self-pay

## 2019-12-19 DIAGNOSIS — I10 Essential (primary) hypertension: Secondary | ICD-10-CM | POA: Diagnosis not present

## 2019-12-19 DIAGNOSIS — Z8679 Personal history of other diseases of the circulatory system: Secondary | ICD-10-CM | POA: Diagnosis not present

## 2019-12-19 DIAGNOSIS — C7951 Secondary malignant neoplasm of bone: Secondary | ICD-10-CM | POA: Diagnosis not present

## 2019-12-19 DIAGNOSIS — C50919 Malignant neoplasm of unspecified site of unspecified female breast: Secondary | ICD-10-CM | POA: Diagnosis not present

## 2019-12-20 DIAGNOSIS — C50919 Malignant neoplasm of unspecified site of unspecified female breast: Secondary | ICD-10-CM | POA: Diagnosis not present

## 2019-12-20 DIAGNOSIS — I10 Essential (primary) hypertension: Secondary | ICD-10-CM | POA: Diagnosis not present

## 2019-12-20 DIAGNOSIS — Z8679 Personal history of other diseases of the circulatory system: Secondary | ICD-10-CM | POA: Diagnosis not present

## 2019-12-20 DIAGNOSIS — C7951 Secondary malignant neoplasm of bone: Secondary | ICD-10-CM | POA: Diagnosis not present

## 2019-12-21 DIAGNOSIS — I10 Essential (primary) hypertension: Secondary | ICD-10-CM | POA: Diagnosis not present

## 2019-12-21 DIAGNOSIS — C7951 Secondary malignant neoplasm of bone: Secondary | ICD-10-CM | POA: Diagnosis not present

## 2019-12-21 DIAGNOSIS — C50919 Malignant neoplasm of unspecified site of unspecified female breast: Secondary | ICD-10-CM | POA: Diagnosis not present

## 2019-12-21 DIAGNOSIS — Z8679 Personal history of other diseases of the circulatory system: Secondary | ICD-10-CM | POA: Diagnosis not present

## 2019-12-22 DIAGNOSIS — I10 Essential (primary) hypertension: Secondary | ICD-10-CM | POA: Diagnosis not present

## 2019-12-22 DIAGNOSIS — Z8679 Personal history of other diseases of the circulatory system: Secondary | ICD-10-CM | POA: Diagnosis not present

## 2019-12-22 DIAGNOSIS — C7951 Secondary malignant neoplasm of bone: Secondary | ICD-10-CM | POA: Diagnosis not present

## 2019-12-22 DIAGNOSIS — C50919 Malignant neoplasm of unspecified site of unspecified female breast: Secondary | ICD-10-CM | POA: Diagnosis not present

## 2019-12-23 DIAGNOSIS — Z8679 Personal history of other diseases of the circulatory system: Secondary | ICD-10-CM | POA: Diagnosis not present

## 2019-12-23 DIAGNOSIS — I10 Essential (primary) hypertension: Secondary | ICD-10-CM | POA: Diagnosis not present

## 2019-12-23 DIAGNOSIS — C7951 Secondary malignant neoplasm of bone: Secondary | ICD-10-CM | POA: Diagnosis not present

## 2019-12-23 DIAGNOSIS — C50919 Malignant neoplasm of unspecified site of unspecified female breast: Secondary | ICD-10-CM | POA: Diagnosis not present

## 2019-12-24 DIAGNOSIS — I10 Essential (primary) hypertension: Secondary | ICD-10-CM | POA: Diagnosis not present

## 2019-12-24 DIAGNOSIS — Z8679 Personal history of other diseases of the circulatory system: Secondary | ICD-10-CM | POA: Diagnosis not present

## 2019-12-24 DIAGNOSIS — C50919 Malignant neoplasm of unspecified site of unspecified female breast: Secondary | ICD-10-CM | POA: Diagnosis not present

## 2019-12-24 DIAGNOSIS — C7951 Secondary malignant neoplasm of bone: Secondary | ICD-10-CM | POA: Diagnosis not present

## 2019-12-25 DIAGNOSIS — C7951 Secondary malignant neoplasm of bone: Secondary | ICD-10-CM | POA: Diagnosis not present

## 2019-12-25 DIAGNOSIS — I10 Essential (primary) hypertension: Secondary | ICD-10-CM | POA: Diagnosis not present

## 2019-12-25 DIAGNOSIS — Z8679 Personal history of other diseases of the circulatory system: Secondary | ICD-10-CM | POA: Diagnosis not present

## 2019-12-25 DIAGNOSIS — C50919 Malignant neoplasm of unspecified site of unspecified female breast: Secondary | ICD-10-CM | POA: Diagnosis not present

## 2019-12-26 DIAGNOSIS — I10 Essential (primary) hypertension: Secondary | ICD-10-CM | POA: Diagnosis not present

## 2019-12-26 DIAGNOSIS — C7951 Secondary malignant neoplasm of bone: Secondary | ICD-10-CM | POA: Diagnosis not present

## 2019-12-26 DIAGNOSIS — C50919 Malignant neoplasm of unspecified site of unspecified female breast: Secondary | ICD-10-CM | POA: Diagnosis not present

## 2019-12-26 DIAGNOSIS — Z8679 Personal history of other diseases of the circulatory system: Secondary | ICD-10-CM | POA: Diagnosis not present

## 2019-12-27 DIAGNOSIS — I10 Essential (primary) hypertension: Secondary | ICD-10-CM | POA: Diagnosis not present

## 2019-12-27 DIAGNOSIS — Z8679 Personal history of other diseases of the circulatory system: Secondary | ICD-10-CM | POA: Diagnosis not present

## 2019-12-27 DIAGNOSIS — C50919 Malignant neoplasm of unspecified site of unspecified female breast: Secondary | ICD-10-CM | POA: Diagnosis not present

## 2019-12-27 DIAGNOSIS — C7951 Secondary malignant neoplasm of bone: Secondary | ICD-10-CM | POA: Diagnosis not present

## 2019-12-28 DIAGNOSIS — Z8679 Personal history of other diseases of the circulatory system: Secondary | ICD-10-CM | POA: Diagnosis not present

## 2019-12-28 DIAGNOSIS — C50919 Malignant neoplasm of unspecified site of unspecified female breast: Secondary | ICD-10-CM | POA: Diagnosis not present

## 2019-12-28 DIAGNOSIS — C7951 Secondary malignant neoplasm of bone: Secondary | ICD-10-CM | POA: Diagnosis not present

## 2019-12-28 DIAGNOSIS — I10 Essential (primary) hypertension: Secondary | ICD-10-CM | POA: Diagnosis not present

## 2019-12-29 DIAGNOSIS — Z8679 Personal history of other diseases of the circulatory system: Secondary | ICD-10-CM | POA: Diagnosis not present

## 2019-12-29 DIAGNOSIS — C7951 Secondary malignant neoplasm of bone: Secondary | ICD-10-CM | POA: Diagnosis not present

## 2019-12-29 DIAGNOSIS — I10 Essential (primary) hypertension: Secondary | ICD-10-CM | POA: Diagnosis not present

## 2019-12-29 DIAGNOSIS — C50919 Malignant neoplasm of unspecified site of unspecified female breast: Secondary | ICD-10-CM | POA: Diagnosis not present

## 2019-12-30 DIAGNOSIS — I10 Essential (primary) hypertension: Secondary | ICD-10-CM | POA: Diagnosis not present

## 2019-12-30 DIAGNOSIS — C50919 Malignant neoplasm of unspecified site of unspecified female breast: Secondary | ICD-10-CM | POA: Diagnosis not present

## 2019-12-30 DIAGNOSIS — C7951 Secondary malignant neoplasm of bone: Secondary | ICD-10-CM | POA: Diagnosis not present

## 2019-12-30 DIAGNOSIS — Z8679 Personal history of other diseases of the circulatory system: Secondary | ICD-10-CM | POA: Diagnosis not present

## 2019-12-31 DIAGNOSIS — I10 Essential (primary) hypertension: Secondary | ICD-10-CM | POA: Diagnosis not present

## 2019-12-31 DIAGNOSIS — C50919 Malignant neoplasm of unspecified site of unspecified female breast: Secondary | ICD-10-CM | POA: Diagnosis not present

## 2019-12-31 DIAGNOSIS — C7951 Secondary malignant neoplasm of bone: Secondary | ICD-10-CM | POA: Diagnosis not present

## 2019-12-31 DIAGNOSIS — Z8679 Personal history of other diseases of the circulatory system: Secondary | ICD-10-CM | POA: Diagnosis not present

## 2020-01-01 DIAGNOSIS — Z8679 Personal history of other diseases of the circulatory system: Secondary | ICD-10-CM | POA: Diagnosis not present

## 2020-01-01 DIAGNOSIS — C50919 Malignant neoplasm of unspecified site of unspecified female breast: Secondary | ICD-10-CM | POA: Diagnosis not present

## 2020-01-01 DIAGNOSIS — I10 Essential (primary) hypertension: Secondary | ICD-10-CM | POA: Diagnosis not present

## 2020-01-01 DIAGNOSIS — C7951 Secondary malignant neoplasm of bone: Secondary | ICD-10-CM | POA: Diagnosis not present

## 2020-01-02 DIAGNOSIS — C50919 Malignant neoplasm of unspecified site of unspecified female breast: Secondary | ICD-10-CM | POA: Diagnosis not present

## 2020-01-02 DIAGNOSIS — Z8679 Personal history of other diseases of the circulatory system: Secondary | ICD-10-CM | POA: Diagnosis not present

## 2020-01-02 DIAGNOSIS — I10 Essential (primary) hypertension: Secondary | ICD-10-CM | POA: Diagnosis not present

## 2020-01-02 DIAGNOSIS — C7951 Secondary malignant neoplasm of bone: Secondary | ICD-10-CM | POA: Diagnosis not present

## 2020-01-03 DIAGNOSIS — I10 Essential (primary) hypertension: Secondary | ICD-10-CM | POA: Diagnosis not present

## 2020-01-03 DIAGNOSIS — C7951 Secondary malignant neoplasm of bone: Secondary | ICD-10-CM | POA: Diagnosis not present

## 2020-01-03 DIAGNOSIS — Z8679 Personal history of other diseases of the circulatory system: Secondary | ICD-10-CM | POA: Diagnosis not present

## 2020-01-03 DIAGNOSIS — C50919 Malignant neoplasm of unspecified site of unspecified female breast: Secondary | ICD-10-CM | POA: Diagnosis not present

## 2020-01-04 DIAGNOSIS — C50919 Malignant neoplasm of unspecified site of unspecified female breast: Secondary | ICD-10-CM | POA: Diagnosis not present

## 2020-01-04 DIAGNOSIS — C7951 Secondary malignant neoplasm of bone: Secondary | ICD-10-CM | POA: Diagnosis not present

## 2020-01-04 DIAGNOSIS — I10 Essential (primary) hypertension: Secondary | ICD-10-CM | POA: Diagnosis not present

## 2020-01-04 DIAGNOSIS — Z8679 Personal history of other diseases of the circulatory system: Secondary | ICD-10-CM | POA: Diagnosis not present

## 2020-01-05 DIAGNOSIS — C50919 Malignant neoplasm of unspecified site of unspecified female breast: Secondary | ICD-10-CM | POA: Diagnosis not present

## 2020-01-05 DIAGNOSIS — C7951 Secondary malignant neoplasm of bone: Secondary | ICD-10-CM | POA: Diagnosis not present

## 2020-01-05 DIAGNOSIS — Z8679 Personal history of other diseases of the circulatory system: Secondary | ICD-10-CM | POA: Diagnosis not present

## 2020-01-05 DIAGNOSIS — I10 Essential (primary) hypertension: Secondary | ICD-10-CM | POA: Diagnosis not present

## 2020-01-06 DIAGNOSIS — I10 Essential (primary) hypertension: Secondary | ICD-10-CM | POA: Diagnosis not present

## 2020-01-06 DIAGNOSIS — C50919 Malignant neoplasm of unspecified site of unspecified female breast: Secondary | ICD-10-CM | POA: Diagnosis not present

## 2020-01-06 DIAGNOSIS — C7951 Secondary malignant neoplasm of bone: Secondary | ICD-10-CM | POA: Diagnosis not present

## 2020-01-06 DIAGNOSIS — Z8679 Personal history of other diseases of the circulatory system: Secondary | ICD-10-CM | POA: Diagnosis not present

## 2020-01-07 DIAGNOSIS — I10 Essential (primary) hypertension: Secondary | ICD-10-CM | POA: Diagnosis not present

## 2020-01-07 DIAGNOSIS — C7951 Secondary malignant neoplasm of bone: Secondary | ICD-10-CM | POA: Diagnosis not present

## 2020-01-07 DIAGNOSIS — Z8679 Personal history of other diseases of the circulatory system: Secondary | ICD-10-CM | POA: Diagnosis not present

## 2020-01-07 DIAGNOSIS — C50919 Malignant neoplasm of unspecified site of unspecified female breast: Secondary | ICD-10-CM | POA: Diagnosis not present

## 2020-02-05 DEATH — deceased

## 2022-03-18 IMAGING — CT CT HEAD W/O CM
4 series · 16 of 47 positions shown, 18 images · non-contrast
Comparison: None.

CLINICAL DATA: Fall

EXAM:
CT HEAD WITHOUT CONTRAST
CT CERVICAL SPINE WITHOUT CONTRAST
TECHNIQUE: Multidetector CT imaging of the head and cervical spine was
performed following the standard protocol without intravenous
contrast. Multiplanar CT image reconstructions of the cervical spine
were also generated.

[Series 3: head wo · axial · 0.44mm/px · z∈[-107,+18]mm · 7 of 35 slices shown, 9 images]
[im 5/35  brain]
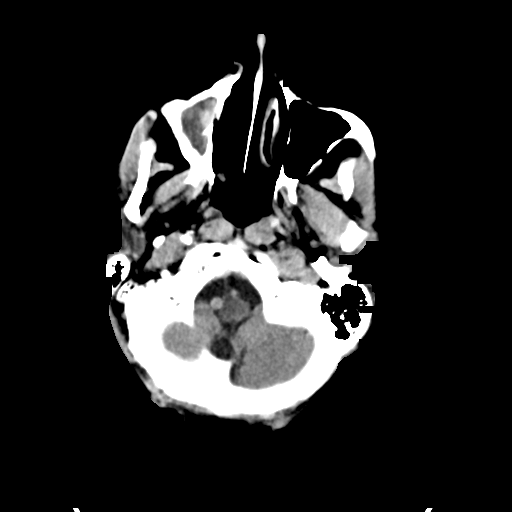
[im 5/35  bone]
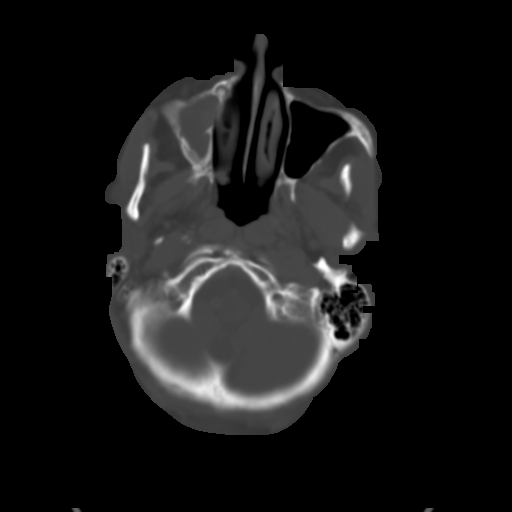
[im 9/35  brain]
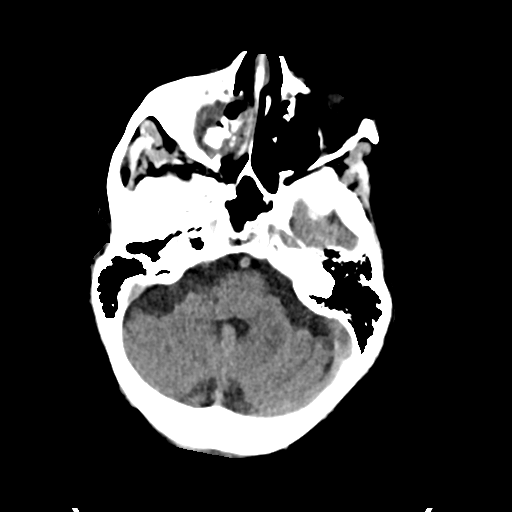
[im 13/35  brain]
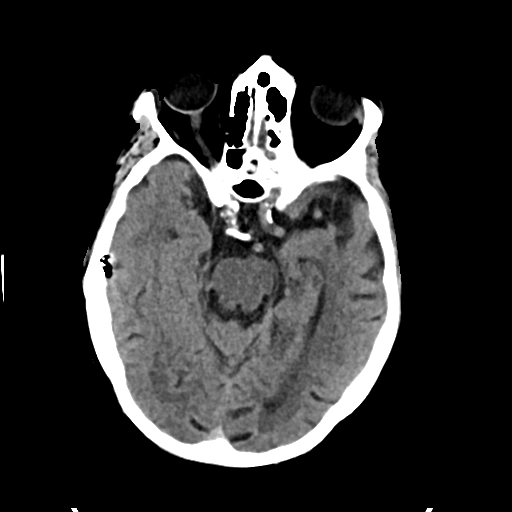
[im 18/35  brain]
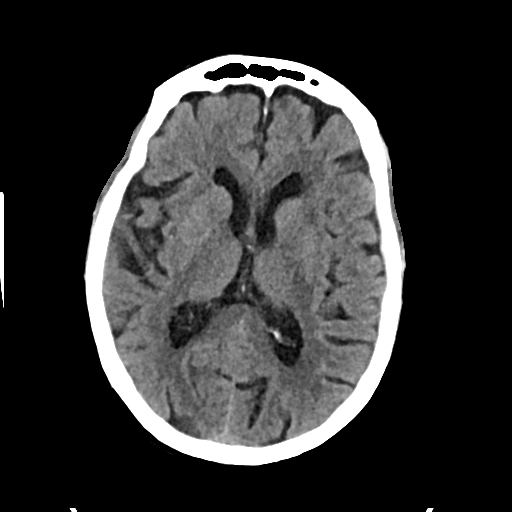
[im 22/35  brain]
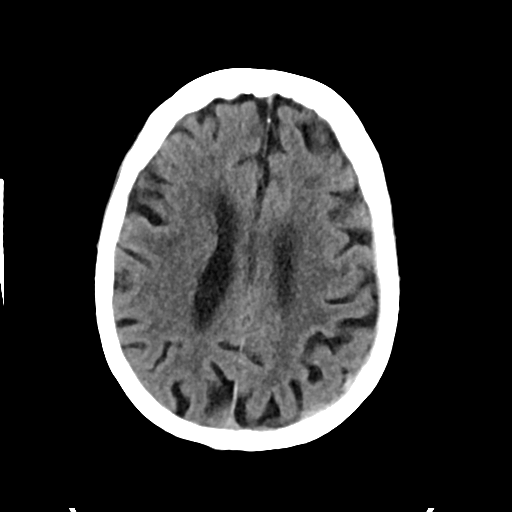
[im 22/35  bone]
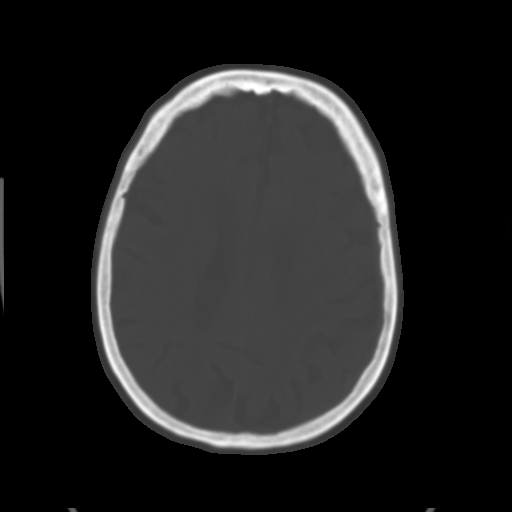
[im 26/35  brain]
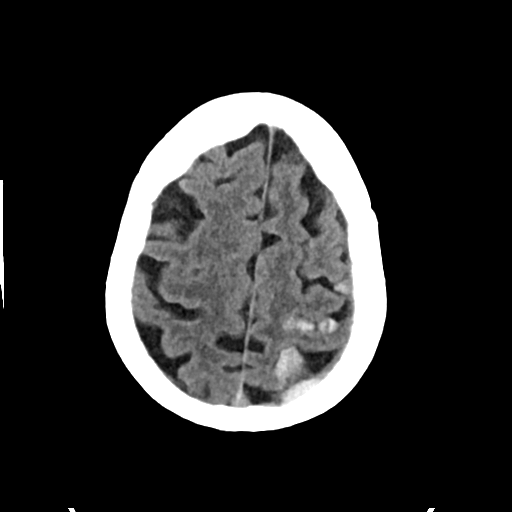
[im 30/35  brain]
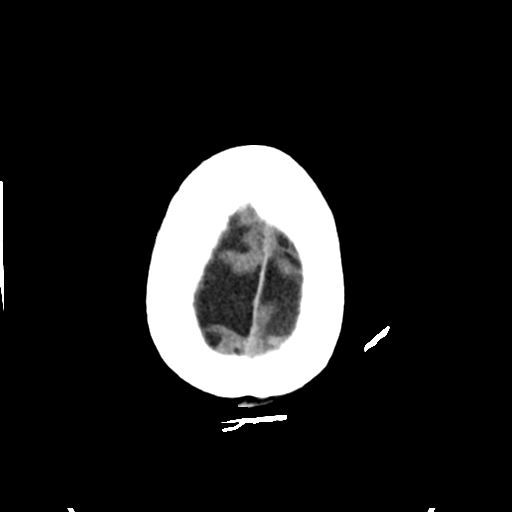

[Series 4: head bone · axial · 0.44mm/px · z∈[-111,-77]mm · 3 of 86 slices shown]
[im 9/86  bone]
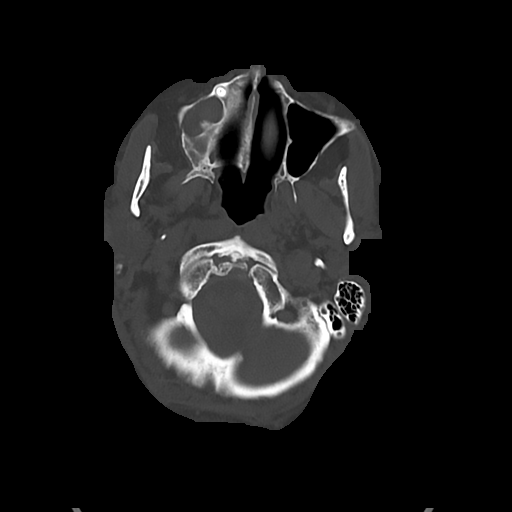
[im 18/86  bone]
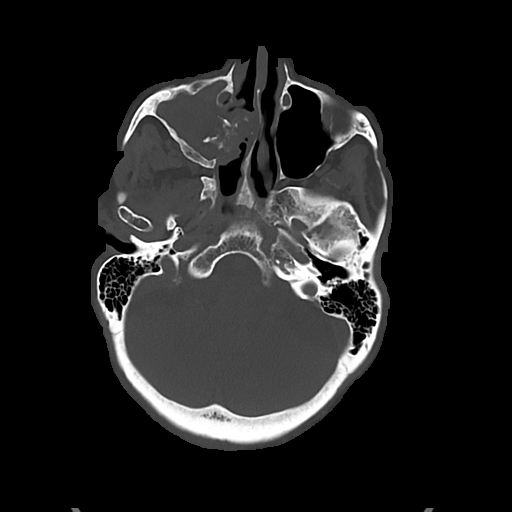
[im 26/86  bone]
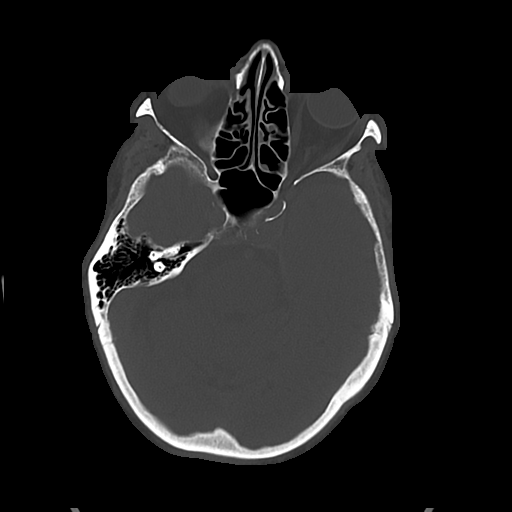

[Series 5: cor soft · coronal · 0.36mm/px · 3 of 76 slices shown]
[im 26/76  brain]
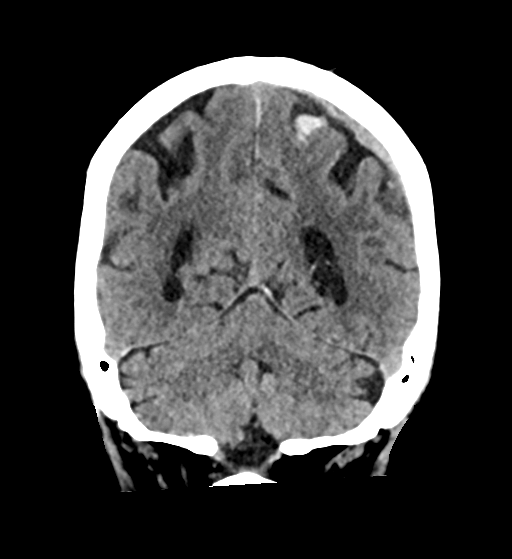
[im 34/76  brain]
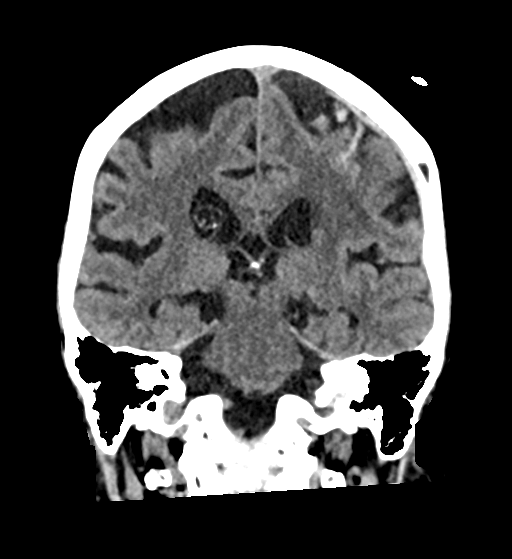
[im 42/76  brain]
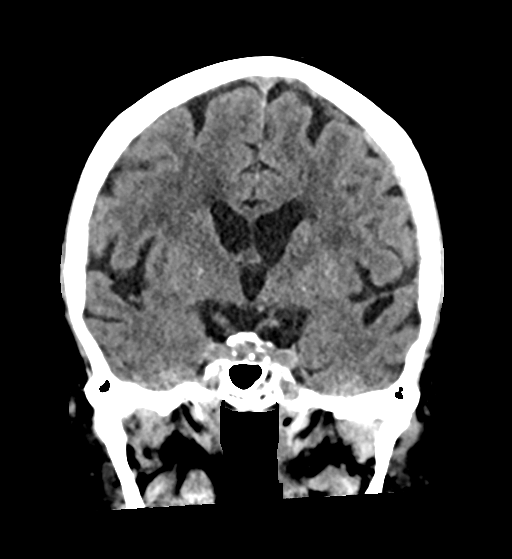

[Series 6: sag soft · sagittal · 0.39mm/px · 3 of 50 slices shown]
[im 17/50  brain]
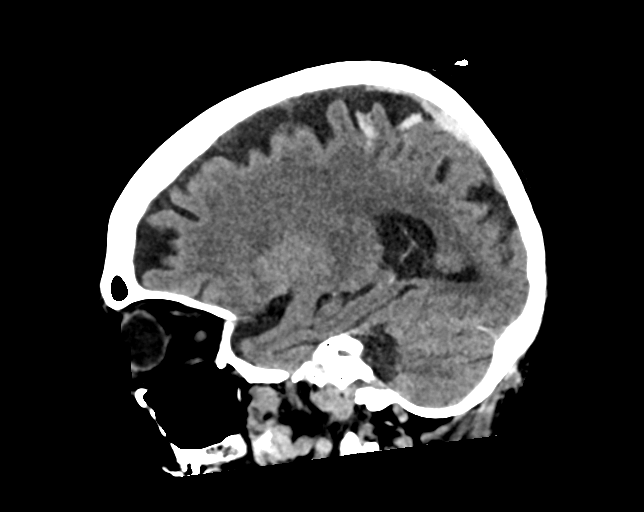
[im 25/50  brain]
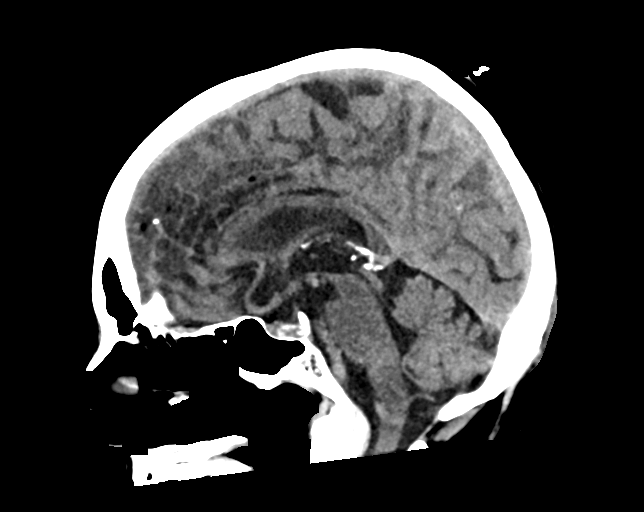
[im 33/50  brain]
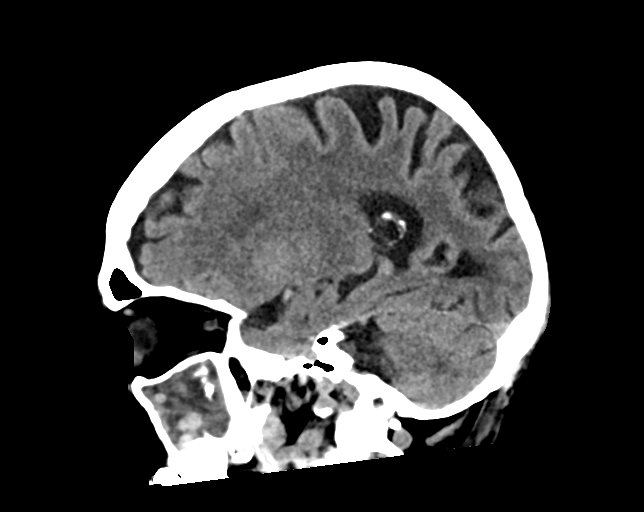

[16 of 47 positions shown; findings below may reference images not displayed]

FINDINGS: CT HEAD FINDINGS

Brain: There is mixed subdural and subarachnoid hemorrhage over the
posterior left hemisphere. There is no associated mass effect.
Subdural component measures 6 mm. There is generalized atrophy
without lobar predilection. There is hypoattenuation of the
periventricular white matter, most commonly indicating chronic
ischemic microangiopathy.

Vascular: No abnormal hyperdensity of the major intracranial
arteries or dural venous sinuses. No intracranial atherosclerosis.

Skull: The visualized skull base, calvarium and extracranial soft
tissues are normal.

Sinuses/Orbits: Chronic right maxillary sinusitis with possible
antrochoanal polyp. The orbits are normal.

CT CERVICAL SPINE FINDINGS

Alignment: No static subluxation. Facets are aligned. Occipital
condyles are normally positioned.

Skull base and vertebrae: No acute fracture.

Soft tissues and spinal canal: No prevertebral fluid or swelling. No
visible canal hematoma.

Disc levels: Multilevel degenerative disc disease and facet
arthrosis. No spinal canal stenosis. There is severe left C7
foraminal stenosis.

Upper chest: Right pleural effusion.  4 mm left apical nodule.

Other: Calcific aortic atherosclerosis.
IMPRESSION: 1. Mixed subdural and subarachnoid hemorrhage over the posterior
left hemisphere without associated mass effect.
2. No acute fracture or static subluxation of the cervical spine.
3. Right pleural effusion.

Aortic Atherosclerosis ([id number].J).

Critical Value/emergent results were called by telephone at the time
of interpretation on 12/05/2019 at [DATE] to provider ASHUK POP
, who verbally acknowledged these results.
